# Patient Record
Sex: Male | Born: 1955 | Race: White | Hispanic: No | Marital: Married | Smoking: Former smoker
Health system: Southern US, Community
[De-identification: ages and names within clinical notes are randomized; demographics above are authoritative.]

## PROBLEM LIST (undated history)

## (undated) DIAGNOSIS — IMO0001 Reserved for inherently not codable concepts without codable children: Secondary | ICD-10-CM

## (undated) DIAGNOSIS — I739 Peripheral vascular disease, unspecified: Secondary | ICD-10-CM

## (undated) DIAGNOSIS — I209 Angina pectoris, unspecified: Secondary | ICD-10-CM

## (undated) DIAGNOSIS — I10 Essential (primary) hypertension: Secondary | ICD-10-CM

## (undated) DIAGNOSIS — K219 Gastro-esophageal reflux disease without esophagitis: Secondary | ICD-10-CM

## (undated) DIAGNOSIS — I2510 Atherosclerotic heart disease of native coronary artery without angina pectoris: Secondary | ICD-10-CM

## (undated) HISTORY — PX: BACK SURGERY: SHX140

---

## 2014-04-12 ENCOUNTER — Emergency Department: Payer: Self-pay | Admitting: Internal Medicine

## 2014-04-12 LAB — BASIC METABOLIC PANEL
ANION GAP: 6 — AB (ref 7–16)
BUN: 15 mg/dL (ref 7–18)
CHLORIDE: 103 mmol/L (ref 98–107)
CO2: 26 mmol/L (ref 21–32)
Calcium, Total: 8 mg/dL — ABNORMAL LOW (ref 8.5–10.1)
Creatinine: 1.01 mg/dL (ref 0.60–1.30)
EGFR (Non-African Amer.): >60
Glucose: 143 mg/dL — ABNORMAL HIGH (ref 65–99)
Osmolality: 273 (ref 275–301)
Potassium: 4.1 mmol/L (ref 3.5–5.1)
Sodium: 135 mmol/L — ABNORMAL LOW (ref 136–145)

## 2014-04-12 LAB — TROPONIN I
Troponin-I: <0.02 ng/mL
Troponin-I: <0.02 ng/mL

## 2014-04-12 LAB — DRUG SCREEN, URINE
Amphetamines, Ur Screen: NEGATIVE (ref ?–1000)
Barbiturates, Ur Screen: NEGATIVE (ref ?–200)
Benzodiazepine, Ur Scrn: NEGATIVE (ref ?–200)
COCAINE METABOLITE, UR ~~LOC~~: NEGATIVE (ref ?–300)
Cannabinoid 50 Ng, Ur ~~LOC~~: POSITIVE (ref ?–50)
MDMA (Ecstasy)Ur Screen: NEGATIVE (ref ?–500)
Methadone, Ur Screen: NEGATIVE (ref ?–300)
Opiate, Ur Screen: NEGATIVE (ref ?–300)
PHENCYCLIDINE (PCP) UR S: NEGATIVE (ref ?–25)
Tricyclic, Ur Screen: NEGATIVE (ref ?–1000)

## 2014-04-12 LAB — CBC
HCT: 47.6 % (ref 40.0–52.0)
HGB: 16 g/dL (ref 13.0–18.0)
MCH: 33.4 pg (ref 26.0–34.0)
MCHC: 33.6 g/dL (ref 32.0–36.0)
MCV: 100 fL (ref 80–100)
PLATELETS: 166 10*3/uL (ref 150–440)
RBC: 4.78 10*6/uL (ref 4.40–5.90)
RDW: 13 % (ref 11.5–14.5)
WBC: 5.5 10*3/uL (ref 3.8–10.6)

## 2014-11-06 DIAGNOSIS — I10 Essential (primary) hypertension: Secondary | ICD-10-CM | POA: Insufficient documentation

## 2014-11-06 DIAGNOSIS — R079 Chest pain, unspecified: Secondary | ICD-10-CM | POA: Insufficient documentation

## 2014-11-07 ENCOUNTER — Ambulatory Visit: Payer: Self-pay | Admitting: Cardiology

## 2014-12-19 ENCOUNTER — Ambulatory Visit
Admit: 2014-12-19 | Disposition: A | Discharge status: Home / Self Care | Payer: Self-pay | Attending: Family Medicine | Admitting: Family Medicine

## 2015-12-04 ENCOUNTER — Other Ambulatory Visit: Payer: Self-pay | Admitting: Cardiology

## 2015-12-04 DIAGNOSIS — I2510 Atherosclerotic heart disease of native coronary artery without angina pectoris: Secondary | ICD-10-CM | POA: Insufficient documentation

## 2015-12-05 ENCOUNTER — Encounter: Payer: Self-pay | Admitting: Certified Registered Nurse Anesthetist

## 2015-12-05 ENCOUNTER — Encounter: Payer: Self-pay | Admitting: *Deleted

## 2015-12-05 ENCOUNTER — Encounter
Admission: RE | Disposition: A | Discharge status: Home / Self Care | Payer: Self-pay | Source: Ambulatory Visit | Attending: Cardiology

## 2015-12-05 ENCOUNTER — Ambulatory Visit
Admission: RE | Admit: 2015-12-05 | Discharge: 2015-12-05 | Disposition: A | Discharge status: Home / Self Care | Payer: Self-pay | Source: Ambulatory Visit | Attending: Cardiology | Admitting: Cardiology

## 2015-12-05 DIAGNOSIS — K219 Gastro-esophageal reflux disease without esophagitis: Secondary | ICD-10-CM | POA: Insufficient documentation

## 2015-12-05 DIAGNOSIS — Z79899 Other long term (current) drug therapy: Secondary | ICD-10-CM | POA: Insufficient documentation

## 2015-12-05 DIAGNOSIS — I259 Chronic ischemic heart disease, unspecified: Secondary | ICD-10-CM | POA: Insufficient documentation

## 2015-12-05 DIAGNOSIS — Z87891 Personal history of nicotine dependence: Secondary | ICD-10-CM | POA: Insufficient documentation

## 2015-12-05 DIAGNOSIS — Z8249 Family history of ischemic heart disease and other diseases of the circulatory system: Secondary | ICD-10-CM | POA: Insufficient documentation

## 2015-12-05 DIAGNOSIS — Z7982 Long term (current) use of aspirin: Secondary | ICD-10-CM | POA: Insufficient documentation

## 2015-12-05 DIAGNOSIS — Z7902 Long term (current) use of antithrombotics/antiplatelets: Secondary | ICD-10-CM | POA: Insufficient documentation

## 2015-12-05 DIAGNOSIS — I10 Essential (primary) hypertension: Secondary | ICD-10-CM | POA: Insufficient documentation

## 2015-12-05 DIAGNOSIS — I25110 Atherosclerotic heart disease of native coronary artery with unstable angina pectoris: Secondary | ICD-10-CM | POA: Insufficient documentation

## 2015-12-05 DIAGNOSIS — E782 Mixed hyperlipidemia: Secondary | ICD-10-CM | POA: Insufficient documentation

## 2015-12-05 DIAGNOSIS — R079 Chest pain, unspecified: Secondary | ICD-10-CM | POA: Insufficient documentation

## 2015-12-05 HISTORY — DX: Essential (primary) hypertension: I10

## 2015-12-05 HISTORY — DX: Atherosclerotic heart disease of native coronary artery without angina pectoris: I25.10

## 2015-12-05 HISTORY — DX: Reserved for inherently not codable concepts without codable children: IMO0001

## 2015-12-05 HISTORY — PX: CARDIAC CATHETERIZATION: SHX172

## 2015-12-05 HISTORY — DX: Gastro-esophageal reflux disease without esophagitis: K21.9

## 2015-12-05 HISTORY — DX: Angina pectoris, unspecified: I20.9

## 2015-12-05 SURGERY — LEFT HEART CATH AND CORONARY ANGIOGRAPHY
Anesthesia: Moderate Sedation

## 2015-12-05 MED ORDER — FENTANYL CITRATE (PF) 100 MCG/2ML IJ SOLN
INTRAMUSCULAR | Status: AC
  Filled 2015-12-05: qty 2

## 2015-12-05 MED ORDER — ISOSORBIDE MONONITRATE ER 60 MG PO TB24: 60.0000 mg | ORAL_TABLET | Freq: Every day | ORAL | Status: AC

## 2015-12-05 MED ORDER — MIDAZOLAM HCL 2 MG/2ML IJ SOLN
INTRAMUSCULAR | Status: DC | PRN
  Administered 2015-12-05: 0.5 mg via INTRAVENOUS
  Administered 2015-12-05: 1 mg via INTRAVENOUS

## 2015-12-05 MED ORDER — MIDAZOLAM HCL 2 MG/2ML IJ SOLN
INTRAMUSCULAR | Status: AC
  Filled 2015-12-05: qty 2

## 2015-12-05 MED ORDER — SODIUM CHLORIDE 0.9% FLUSH: 3.0000 mL | Freq: Two times a day (BID) | INTRAVENOUS | Status: DC

## 2015-12-05 MED ORDER — SODIUM CHLORIDE 0.9 % WEIGHT BASED INFUSION
3.0000 mL/kg/h | INTRAVENOUS | Status: DC
  Administered 2015-12-05: 3 mL/kg/h via INTRAVENOUS

## 2015-12-05 MED ORDER — FENTANYL CITRATE (PF) 100 MCG/2ML IJ SOLN
INTRAMUSCULAR | Status: DC | PRN
  Administered 2015-12-05 (×2): 25 ug via INTRAVENOUS

## 2015-12-05 MED ORDER — NITROGLYCERIN 2 % TD OINT
TOPICAL_OINTMENT | TRANSDERMAL | Status: DC | PRN
  Administered 2015-12-05: 1 [in_us] via TOPICAL

## 2015-12-05 MED ORDER — SODIUM CHLORIDE 0.9% FLUSH: 3.0000 mL | INTRAVENOUS | Status: DC | PRN

## 2015-12-05 MED ORDER — NITROGLYCERIN 2 % TD OINT
TOPICAL_OINTMENT | TRANSDERMAL | Status: AC
  Filled 2015-12-05: qty 1

## 2015-12-05 MED ORDER — SODIUM CHLORIDE 0.9 % IV SOLN: 250.0000 mL | INTRAVENOUS | Status: DC | PRN

## 2015-12-05 MED ORDER — IOHEXOL 300 MG/ML  SOLN
INTRAMUSCULAR | Status: DC | PRN
  Administered 2015-12-05: 95 mL via INTRA_ARTERIAL

## 2015-12-05 MED ORDER — HEPARIN (PORCINE) IN NACL 2-0.9 UNIT/ML-% IJ SOLN
INTRAMUSCULAR | Status: AC
  Filled 2015-12-05: qty 500

## 2015-12-05 SURGICAL SUPPLY — 8 items
CATH INFINITI 5FR ANG PIGTAIL (CATHETERS) ×3 IMPLANT
CATH INFINITI 5FR JL4 (CATHETERS) ×3 IMPLANT
CATH INFINITI JR4 5F (CATHETERS) ×3 IMPLANT
KIT MANI 3VAL PERCEP (MISCELLANEOUS) ×3 IMPLANT
NEEDLE PERC 18GX7CM (NEEDLE) ×3 IMPLANT
PACK CARDIAC CATH (CUSTOM PROCEDURE TRAY) ×3 IMPLANT
SHEATH PINNACLE 5F 10CM (SHEATH) ×3 IMPLANT
WIRE EMERALD 3MM-J .035X150CM (WIRE) ×3 IMPLANT

## 2015-12-05 NOTE — Discharge Instructions (Signed)
Angiogram, Care After °Refer to this sheet in the next few weeks. These instructions provide you with information about caring for yourself after your procedure. Your health care provider may also give you more specific instructions. Your treatment has been planned according to current medical practices, but problems sometimes occur. Call your health care provider if you have any problems or questions after your procedure. °WHAT TO EXPECT AFTER THE PROCEDURE °After your procedure, it is typical to have the following: °· Bruising at the catheter insertion site that usually fades within 1-2 weeks. °· Blood collecting in the tissue (hematoma) that may be painful to the touch. It should usually decrease in size and tenderness within 1-2 weeks. °HOME CARE INSTRUCTIONS °· Take medicines only as directed by your health care provider. °· You may shower 24-48 hours after the procedure or as directed by your health care provider. Remove the bandage (dressing) and gently wash the site with plain soap and water. Pat the area dry with a clean towel. Do not rub the site, because this may cause bleeding. °· Do not take baths, swim, or use a hot tub until your health care provider approves. °· Check your insertion site every day for redness, swelling, or drainage. °· Do not apply powder or lotion to the site. °· Do not lift over 10 lb (4.5 kg) for 5 days after your procedure or as directed by your health care provider. °· Ask your health care provider when it is okay to: °¨ Return to work or school. °¨ Resume usual physical activities or sports. °¨ Resume sexual activity. °· Do not drive home if you are discharged the same day as the procedure. Have someone else drive you. °· You may drive 24 hours after the procedure unless otherwise instructed by your health care provider. °· Do not operate machinery or power tools for 24 hours after the procedure or as directed by your health care provider. °· If your procedure was done as an  outpatient procedure, which means that you went home the same day as your procedure, a responsible adult should be with you for the first 24 hours after you arrive home. °· Keep all follow-up visits as directed by your health care provider. This is important. °SEEK MEDICAL CARE IF: °· You have a fever. °· You have chills. °· You have increased bleeding from the catheter insertion site. Hold pressure on the site. °SEEK IMMEDIATE MEDICAL CARE IF: °· You have unusual pain at the catheter insertion site. °· You have redness, warmth, or swelling at the catheter insertion site. °· You have drainage (other than a small amount of blood on the dressing) from the catheter insertion site. °· The catheter insertion site is bleeding, and the bleeding does not stop after 30 minutes of holding steady pressure on the site. °· The area near or just beyond the catheter insertion site becomes pale, cool, tingly, or numb. °  °This information is not intended to replace advice given to you by your health care provider. Make sure you discuss any questions you have with your health care provider. °  °Document Released: 03/20/2005 Document Revised: 09/22/2014 Document Reviewed: 02/02/2013 °Elsevier Interactive Patient Education ©2016 [employer] ° °

## 2015-12-05 NOTE — H&P (Signed)
Chief Complaint: Chief Complaint  Patient presents with  . 6 month follow up  no complaints with heart  Date of Service: 11/19/2015 Date of Birth: April 22, 1956 PCP: Malva Limes, MD  History of Present Illness: Brad Clark is a 60 y.o.male patient who returns for follow-up visit. He is status post a cardiac catheterization revealing 95% mid left circumflex stenosis. There was also an 80% distal left circumflex is stenosis he underwent PCI of both lesions with placement of a Xience 2.25 x 15 mm stent in the proximal left circumflex and a 2.0 x 12 mm bare metal stent in the more distal lesion. He is doing fairly well since this procedure. He has been compliant with clopidogrel. He is a pretty well with no chest pain or shortness of breath. He is compliant with his medications and able to carry out daily activities without symptoms. He is limited somewhat by back pain. He is on dual antiplatelet therapy with aspirin and clopidogrel. He has self discontinued lovastatin.  Past Medical and Surgical History  Past Medical History Past Medical History  Diagnosis Date  . GERD (gastroesophageal reflux disease)  . Hyperlipidemia, unspecified  . Hypertension   Past Surgical History He has no past surgical history on file.   Medications and Allergies  Current Medications  Current Outpatient Prescriptions  Medication Sig Dispense Refill  . aspirin 81 MG chewable tablet Take 81 mg by mouth once daily.  . clopidogrel (PLAVIX) 75 mg tablet Take 1 tablet (75 mg total) by mouth once daily. 30 tablet 6  . ibuprofen (ADVIL,MOTRIN) 200 MG tablet Take 200 mg by mouth every 6 (six) hours as needed for Pain.  . metoprolol succinate (TOPROL-XL) 25 MG XL tablet Take 1 tablet (25 mg total) by mouth once daily. 30 tablet 11  . nitroGLYcerin (NITROSTAT) 0.4 MG SL tablet Place under the tongue.  . pantoprazole (PROTONIX) 40 MG DR tablet Take 40 mg by mouth once daily.   No current facility-administered  medications for this visit.   Allergies: Review of patient's allergies indicates no known allergies.  Social and Family History  Social History reports that he has been smoking Cigarettes. He does not have any smokeless tobacco history on file. He reports that he drinks alcohol.  Family History History reviewed. No pertinent family history.  Review of Systems  Review of Systems  Constitutional: Negative for chills, diaphoresis, fever, malaise/fatigue and weight loss.  HENT: Negative for congestion, ear discharge, hearing loss and tinnitus.  Eyes: Negative for blurred vision.  Respiratory: Negative for cough, hemoptysis, sputum production, shortness of breath and wheezing.  Cardiovascular: Negative for chest pain, palpitations, orthopnea, claudication, leg swelling and PND.  Gastrointestinal: Negative for abdominal pain, blood in stool, constipation, diarrhea, heartburn, melena, nausea and vomiting.  Genitourinary: Negative for dysuria, frequency, hematuria and urgency.  Musculoskeletal: Positive for back pain and joint pain. Negative for falls and myalgias.  Skin: Negative for itching and rash.  Neurological: Negative for dizziness, tingling, focal weakness, loss of consciousness, weakness and headaches.  Endo/Heme/Allergies: Negative for polydipsia. Does not bruise/bleed easily.  Psychiatric/Behavioral: Negative for depression, memory loss and substance abuse. The patient is not nervous/anxious.    Physical Examination   Vitals: Visit Vitals  . BP (!) 160/102  . Pulse 76  . Resp 12  . Ht 170.2 cm ( )  . Wt 95.3 kg (210 lb)  . BMI 32.89 kg/m2   Ht:170.2 cm ( ) Wt:95.3 kg (210 lb) ZOX:WRUE surface area is 2.12 meters squared.  Body mass index is 32.89 kg/(m^2).  Wt Readings from Last 3 Encounters:  11/19/15 95.3 kg (210 lb)  05/08/15 93.9 kg (207 lb)  11/13/14 93.1 kg (205 lb 3.2 oz)   BP Readings from Last 3 Encounters:  11/19/15 (!) 160/102  05/08/15 160/80   11/13/14 142/82   General appearance appears in no acute distress  Head Mouth and Eye exam Normocephalic, without obvious abnormality, atraumatic Dentition is good Eyes appear anicteric   Neck exam Thyroid: normal  Nodes: no obvious adenopathy  LUNGS Breath Sounds: Normal Percussion: Normal  CARDIOVASCULAR JVP CV wave: no HJR: no Elevation at 90 degrees: None Carotid Pulse: normal pulsation bilaterally Bruit: None Apex: apical impulse normal  Auscultation Rhythm: normal sinus rhythm S1: normal S2: normal Clicks: no Rub: no Murmurs: no murmurs  Gallop: None ABDOMEN Liver enlargement: no Pulsatile aorta: no Ascites: no Bruits: no  EXTREMITIES Clubbing: no Edema: trace to 1+ bilateral pedal edema Pulses: peripheral pulses symmetrical Femoral Bruits: no Amputation: no SKIN Rash: no Cyanosis: no Embolic phemonenon: no Bruising: no NEURO Alert and Oriented to person, place and time: yes Non focal: yes  PSYCH: Pt appears to have normal affect  LABS REVIEWED Last 3 CBC results: Lab Results  Component Value Date  WBC 6.4 11/06/2014   Lab Results  Component Value Date  HGB 16.4 11/06/2014   Lab Results  Component Value Date  HCT 46.4 11/06/2014   Lab Results  Component Value Date  PLT 183 11/06/2014   Lab Results  Component Value Date  CREATININE 1.1 11/06/2014  BUN 9 11/06/2014  NA 137 11/06/2014  K 4.3 11/06/2014  CL 101 11/06/2014  CO2 31.3 11/06/2014   Assessment and Plan   60 y.o. male with  ICD-10-CM ICD-9-CM  1. CAD S/P percutaneous coronary angioplasty-appears to be doing quite well from a cardiac standpoint post PCI. Continue on dual antiplatelet therapy I25.10 414.01  Z98.61 V45.82  2. Essential hypertension-blood pressure is controlled with current regimen. Dash diet is recommended I10 401.9  3. Familial multiple lipoprotein-type hyperlipidemia-continue with low-fat diet. Statin would be recommended however the patient  defers this at present. E78.4 272.4  4. SOB (shortness of breath)-currently stay R06.02 786.05  5. Chest pain on exertion-no current chest pain R07.9 786.50  6. Atypical chest pain-Persistant recurrent chest pain similar to angina. Angina is unstable. Will proceed with cardiac cath in am to evaluate anatomy. Advised to go to er if symptoms worsen.   ).  These notes generated with voice recognition software. I apologize for typographical errors.  Brad ArKENNETH ALAN Guiseppe Flanagan, MD

## 2015-12-06 ENCOUNTER — Other Ambulatory Visit: Payer: Self-pay | Admitting: Student

## 2015-12-06 DIAGNOSIS — M545 Low back pain, unspecified: Secondary | ICD-10-CM

## 2015-12-13 ENCOUNTER — Telehealth: Payer: Self-pay | Admitting: Family Medicine

## 2015-12-13 NOTE — Telephone Encounter (Signed)
I looked in patient chart, both in [employer] and Epic and found no past history of Gout. Its been almost a year since patient's last office visit. I called patient back to get more information. He states "I have swelling in my big toe and need gout medication called into the pharmacy". I advised patient that we have no record of him having a history of Gout. Patient replies " I had it a long time ago and was treated somewhere else". I advised patient that it has been almost 1 year since his last office visit. Patient interrupts me and says "Im not coming in for an office visit, I just need medication; that's the least you could do". He then says "just forget it. Thanks for your time" and then hangs the phone up.

## 2015-12-13 NOTE — Telephone Encounter (Signed)
Pt is wanting to see if you would give him medication gout and would like to have it sent to [facility] at [address].  Pt states he has no insurance.  His callback number is [phone] which is his work number.

## 2015-12-28 ENCOUNTER — Ambulatory Visit
Admission: RE | Admit: 2015-12-28 | Discharge: 2015-12-28 | Disposition: A | Discharge status: Home / Self Care | Payer: Self-pay | Source: Ambulatory Visit | Attending: Student | Admitting: Student

## 2015-12-28 DIAGNOSIS — M4806 Spinal stenosis, lumbar region: Secondary | ICD-10-CM | POA: Insufficient documentation

## 2015-12-28 DIAGNOSIS — M545 Low back pain, unspecified: Secondary | ICD-10-CM

## 2015-12-28 DIAGNOSIS — M5126 Other intervertebral disc displacement, lumbar region: Secondary | ICD-10-CM | POA: Insufficient documentation

## 2017-11-02 IMAGING — MR MR LUMBAR SPINE W/O CM
4 of 5 series · 24 of 48 positions shown · non-contrast
Comparison: Lumbar spine radiographs 12/19/2014. Report of MRI
lumbar spine 07/11/2003.

CLINICAL DATA: Low back pain with left hip soreness over the last 3
months. Lumbar spine surgery in 5111.

EXAM:
MRI LUMBAR SPINE WITHOUT CONTRAST
TECHNIQUE: Multiplanar, multisequence MR imaging of the lumbar spine was
performed. No intravenous contrast was administered.

[Series 2: T2 · sagittal · 4.0mm · 0.81mm/px · 6 of 15 slices shown (1 of 2)]
[im 1/15]
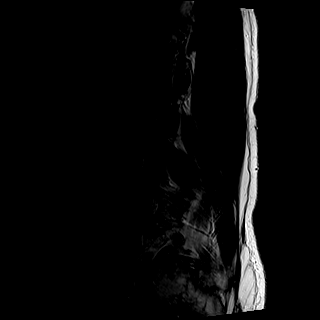
[im 3/15]
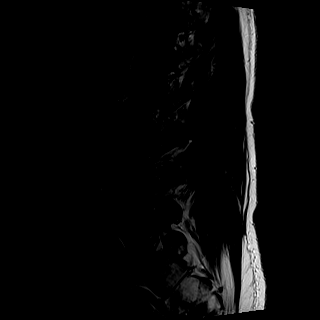
[im 6/15]
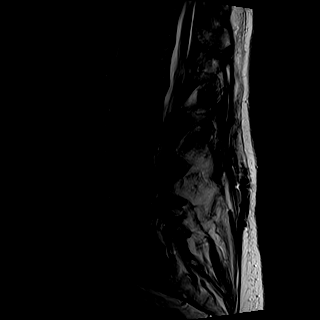
[im 9/15]
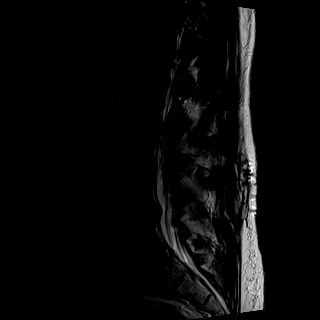
[im 12/15]
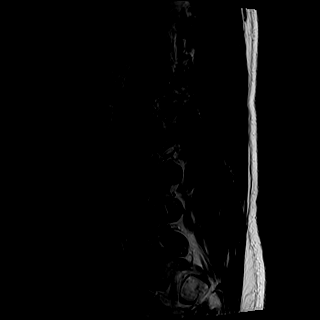
[im 15/15]
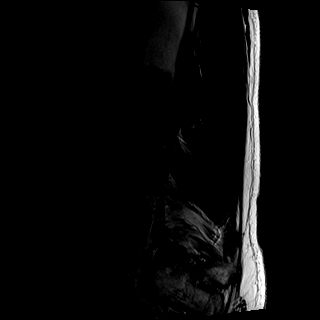

[Series 3: T1 · sagittal · 4.0mm · 0.41mm/px · 6 of 15 slices shown (1 of 2)]
[im 1/15]
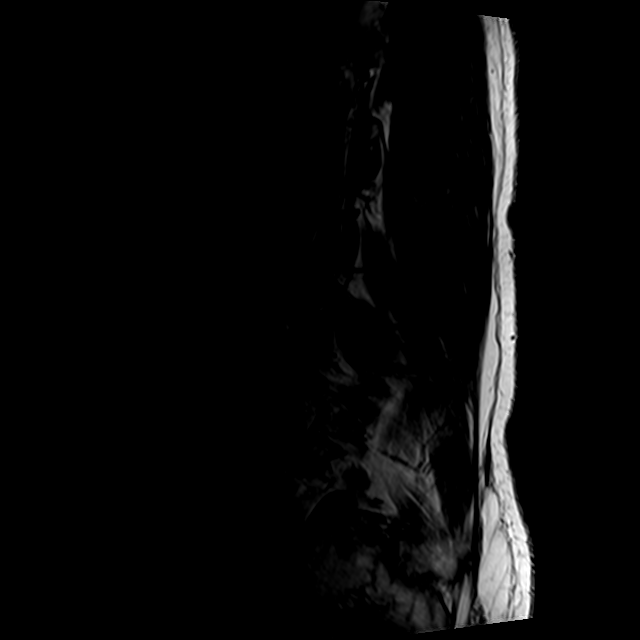
[im 3/15]
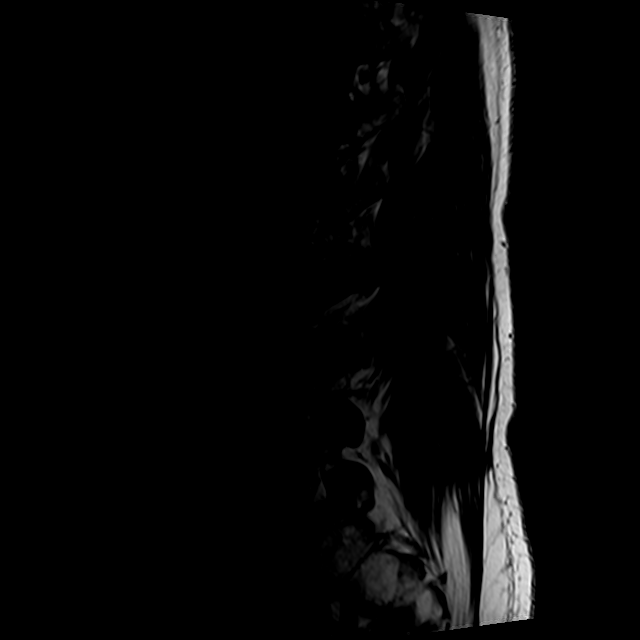
[im 6/15]
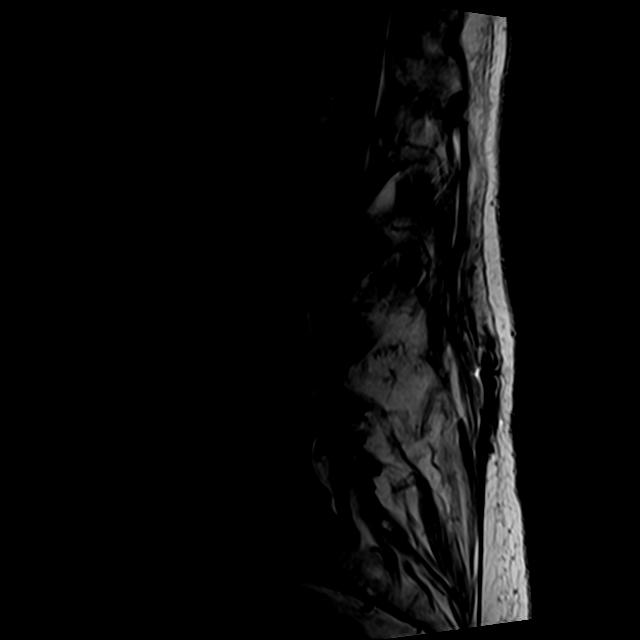
[im 9/15]
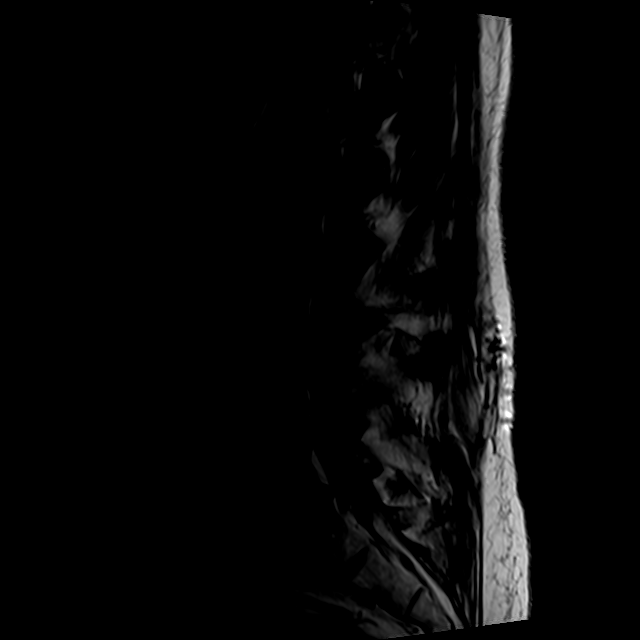
[im 12/15]
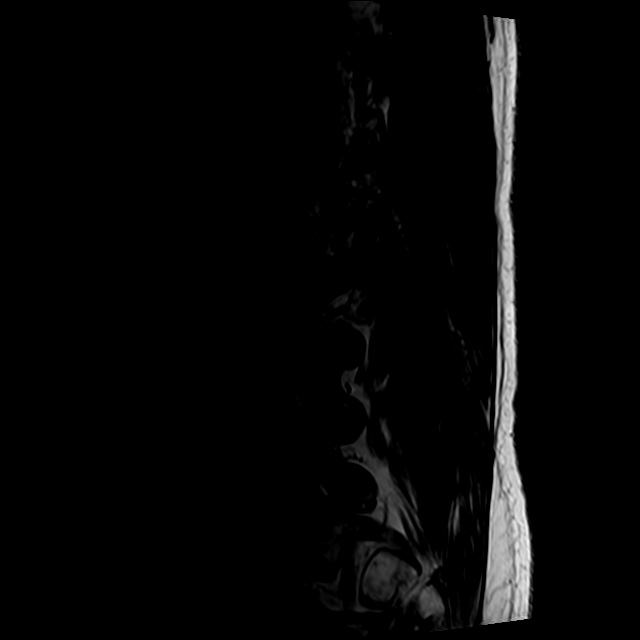
[im 15/15]
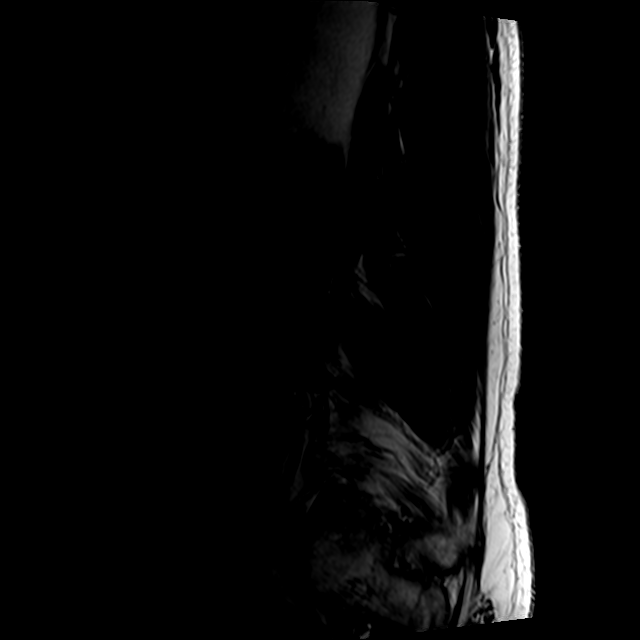

[Series 5: T2 · axial · 4.0mm · 0.78mm/px · z∈[-45,+179]mm · 9 of 41 slices shown (2 of 2)]
[im 1/41]
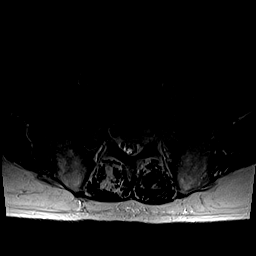
[im 6/41]
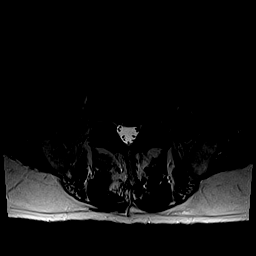
[im 12/41]
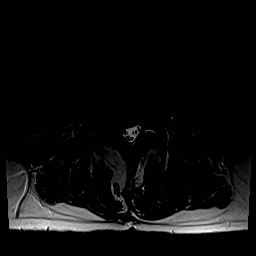
[im 18/41]
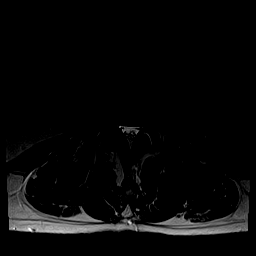
[im 21/41]
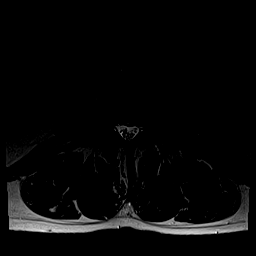
[im 23/41]
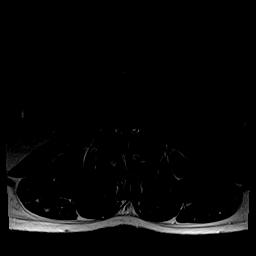
[im 29/41]
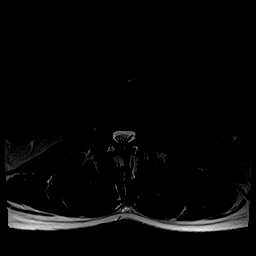
[im 35/41]
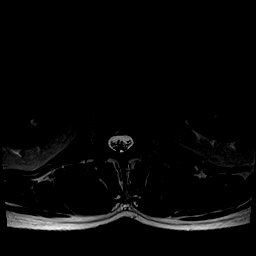
[im 41/41]
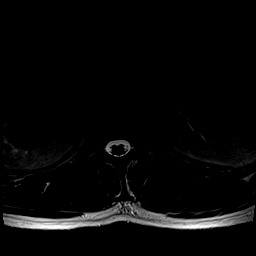

[Series 6: T1 · axial · 4.0mm · 0.31mm/px · z∈[-20,+149]mm · 3 of 41 slices shown (2 of 2)]
[im 6/41]
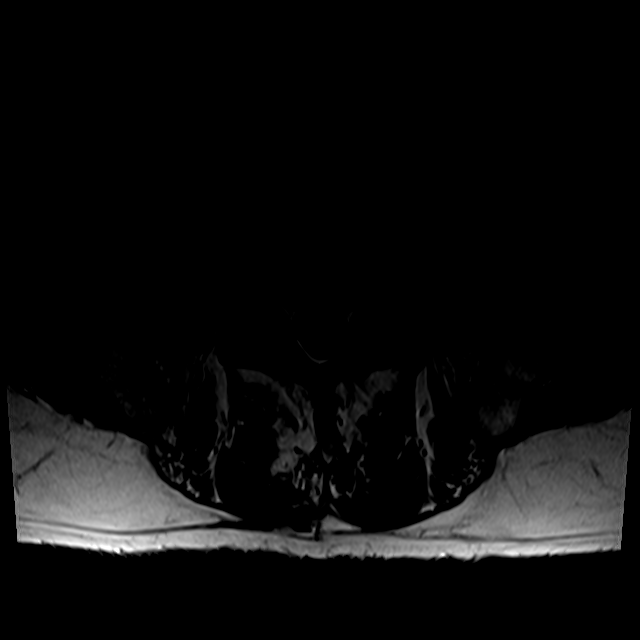
[im 21/41]
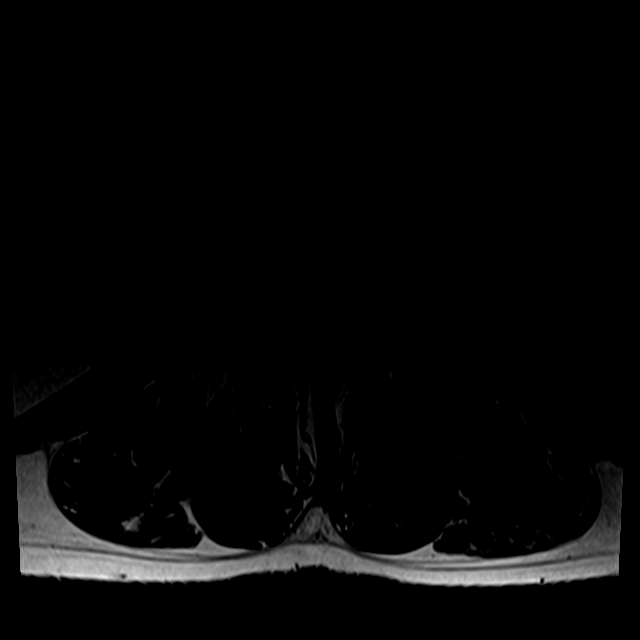
[im 35/41]
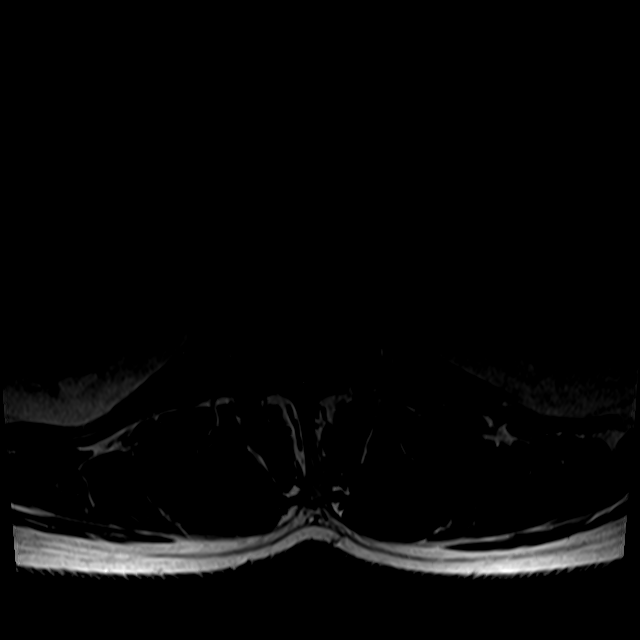

[24 of 48 positions shown; findings below may reference images not displayed]

FINDINGS: Normal signal is present a conus medullaris which terminates at L1.
Chronic endplate marrow changes are evident at L4-5. These are worse
on the right. Asymmetric left-sided endplate marrow changes are
present at L5-S1. Vertebral body heights and AP alignment are
normal.

Limited imaging of the abdomen is unremarkable. There is no
significant adenopathy. Atherosclerotic changes are evident in the
aorta without aneurysm.

L1-2: Mild disc bulging and facet hypertrophy is present bilaterally
without significant stenosis.

L2-3: A broad-based disc protrusion is present. Moderate facet
hypertrophy and short pedicles results an moderate to severe central
canal stenosis. Subarticular narrowing is worse on the left. Mild
foraminal narrowing is present bilaterally.

L3-4: A broad-based disc protrusion is present. Mild facet
hypertrophy and short pedicles contribute to mild subarticular
narrowing bilaterally. Mild to moderate foraminal stenosis is worse
on the right.

L4-5: A right laminectomy is present. A recurrent right paramedian
disc protrusion is noted. This results in moderate right and mild
left subarticular narrowing. Moderate right and mild left foraminal
stenosis is present as well.

L5-S1: A shallow central disc protrusion is present. The central
canal is patent. The a leftward component contributes to mild left
foraminal stenosis.
IMPRESSION: 1. Broad-based disc protrusion, moderate facet hypertrophy, and
short pedicles at L2-3 results and moderate to severe central canal
stenosis.
2. Subarticular narrowing at L2-3 is worse on the left.
3. Mild foraminal narrowing bilaterally at L2-3.
4. Mild subarticular narrowing at L3-4 with mild to moderate
foraminal stenosis, worse on the right.
5. Right laminectomy at L4-5 with the re- current right paramedian
disc protrusion resulting in moderate right and mild left
subarticular narrowing and foraminal stenosis.
6. Shallow central disc protrusion and far left lateral component at
L5-S1 with mild left foraminal stenosis.

## 2019-03-03 DIAGNOSIS — E1169 Type 2 diabetes mellitus with other specified complication: Secondary | ICD-10-CM | POA: Insufficient documentation

## 2019-03-03 DIAGNOSIS — E119 Type 2 diabetes mellitus without complications: Secondary | ICD-10-CM | POA: Insufficient documentation

## 2019-03-03 DIAGNOSIS — E785 Hyperlipidemia, unspecified: Secondary | ICD-10-CM | POA: Insufficient documentation

## 2019-03-11 ENCOUNTER — Ambulatory Visit (INDEPENDENT_AMBULATORY_CARE_PROVIDER_SITE_OTHER): Payer: Self-pay | Admitting: Vascular Surgery

## 2019-03-11 ENCOUNTER — Other Ambulatory Visit: Payer: Self-pay

## 2019-03-11 ENCOUNTER — Encounter (INDEPENDENT_AMBULATORY_CARE_PROVIDER_SITE_OTHER): Payer: Self-pay | Admitting: Vascular Surgery

## 2019-03-11 VITALS — BP 168/98 | HR 94 | Resp 16 | Ht 69.0 in | Wt 203.0 lb

## 2019-03-11 DIAGNOSIS — E1151 Type 2 diabetes mellitus with diabetic peripheral angiopathy without gangrene: Secondary | ICD-10-CM

## 2019-03-11 DIAGNOSIS — I70213 Atherosclerosis of native arteries of extremities with intermittent claudication, bilateral legs: Secondary | ICD-10-CM

## 2019-03-11 DIAGNOSIS — I70219 Atherosclerosis of native arteries of extremities with intermittent claudication, unspecified extremity: Secondary | ICD-10-CM | POA: Insufficient documentation

## 2019-03-11 DIAGNOSIS — I2510 Atherosclerotic heart disease of native coronary artery without angina pectoris: Secondary | ICD-10-CM

## 2019-03-11 DIAGNOSIS — E119 Type 2 diabetes mellitus without complications: Secondary | ICD-10-CM | POA: Insufficient documentation

## 2019-03-11 DIAGNOSIS — I10 Essential (primary) hypertension: Secondary | ICD-10-CM

## 2019-03-11 DIAGNOSIS — F17200 Nicotine dependence, unspecified, uncomplicated: Secondary | ICD-10-CM

## 2019-03-11 DIAGNOSIS — Z79899 Other long term (current) drug therapy: Secondary | ICD-10-CM

## 2019-03-11 NOTE — Assessment & Plan Note (Signed)
blood pressure control important in reducing the progression of atherosclerotic disease. On appropriate oral medications.  

## 2019-03-11 NOTE — Assessment & Plan Note (Signed)
The patient does have significant symptoms of claudication in both lower extremities with noninvasive studies that would demonstrate significant arterial insufficiency in both legs.  At this point we had a long discussion regarding the pathophysiology and natural history of peripheral arterial disease.  I have strongly urged him to stop smoking and he is going to give an earnest effort to quit.  He does not have any immediate limb threatening symptoms, but his symptoms are quite disabling.  We are going to give him a trial of exercise, improve blood pressure and glucose control, and an attempt at smoking cessation over the next 4 to 6 weeks.  If his symptoms do not significantly improve with his attempted conservative management, consideration for intervention will be given to both lower extremities.  If he develops rest pain or ulceration in the interim, he will contact our office and intervention should be performed.  I discussed at this point he does not have any immediate limb threatening symptoms, and that a trial of conservative therapy is appropriate

## 2019-03-11 NOTE — Assessment & Plan Note (Signed)
blood glucose control important in reducing the progression of atherosclerotic disease. Also, involved in wound healing. On appropriate medications.  

## 2019-03-11 NOTE — Progress Notes (Signed)
Patient ID: Brad Clark, male   DOB: 1955/10/08, 63 y.o.   MRN: [id number]  Chief Complaint  Patient presents with  . New Patient (Initial Visit)    ref fath for PVD    HPI Brad Clark is a 63 y.o. male.  I am asked to see the patient by Dr. Ubaldo Glassing for evaluation of PAD with claudication.  He reports several months of worsening pain in his legs with activity.  He can really only walk about 50 to 100 feet before having to stop and rest at this point.  Both legs are affected and he says about the same.  His activity level has definitely dropped over the past several months.  He says when he is at rest, he currently does not really have pain but when he starts walking his calves and lower legs burn and ache.  The pain becomes debilitating and eventually he has to stop.  No open ulcerations or infection.  His cardiologist evaluated him and performed ABIs which showed a right ABI of 0.63 and a left ABI of 0.79 with blunted waveforms bilaterally.  Given his significant peripheral arterial disease, he is referred for further evaluation and treatment.  He is trying to stop smoking but does still smoke.  There is no clear inciting event or causative factor that started the symptoms.     Past Medical History:  Diagnosis Date  . Anginal pain ([place])   . Coronary artery disease   . GERD (gastroesophageal reflux disease)   . Hypertension   . Shortness of breath dyspnea     Past Surgical History:  Procedure Laterality Date  . BACK SURGERY    . CARDIAC CATHETERIZATION    . CARDIAC CATHETERIZATION N/A 12/05/2015   Procedure: Left Heart Cath and Coronary Angiography;  Surgeon: Teodoro Spray, MD;  Location: [facility];  Service: Cardiovascular;  Laterality: N/A;    Family History Family History  Problem Relation Age of Onset  . Congenital heart disease Father   . Diabetes Brother   No bleeding or clotting disorders.  No aneurysms  Social History Was smoking >1 PPD,  trying to quit Married Regular ETOH use No IVDU  No Known Allergies  Current Outpatient Medications  Medication Sig Dispense Refill  . aspirin 81 MG tablet Take 81 mg by mouth daily.    . chlorthalidone (HYGROTON) 25 MG tablet Take 25 mg by mouth daily.    . clopidogrel (PLAVIX) 75 MG tablet Take 75 mg by mouth daily.    . isosorbide mononitrate (IMDUR) 60 MG 24 hr tablet Take 1 tablet (60 mg total) by mouth daily. 30 tablet 11  . metFORMIN (GLUCOPHAGE) 500 MG tablet Take 1,000 mg by mouth 2 (two) times daily with a meal.    . metoprolol succinate (TOPROL-XL) 25 MG 24 hr tablet Take 25 mg by mouth daily.    . naproxen sodium (ANAPROX) 220 MG tablet Take 220 mg by mouth daily as needed.    . nitroGLYCERIN (NITROSTAT) 0.4 MG SL tablet Place 0.4 mg under the tongue every 5 (five) minutes as needed for chest pain.    . pantoprazole (PROTONIX) 40 MG tablet Take 40 mg by mouth daily.    . rosuvastatin (CRESTOR) 5 MG tablet Take by mouth.     No current facility-administered medications for this visit.       REVIEW OF SYSTEMS (Negative unless checked)  Constitutional: [] Weight loss  [] Fever  [] Chills Cardiac: [] Chest pain   []   Chest pressure   [] Palpitations   [] Shortness of breath when laying flat   [] Shortness of breath at rest   [] Shortness of breath with exertion. Vascular:  [x] Pain in legs with walking   [] Pain in legs at rest   [] Pain in legs when laying flat   [x] Claudication   [] Pain in feet when walking  [] Pain in feet at rest  [] Pain in feet when laying flat   [] History of DVT   [] Phlebitis   [] Swelling in legs   [] Varicose veins   [] Non-healing ulcers Pulmonary:   [] Uses home oxygen   [] Productive cough   [] Hemoptysis   [] Wheeze  [] COPD   [] Asthma Neurologic:  [] Dizziness  [] Blackouts   [] Seizures   [] History of stroke   [] History of TIA  [] Aphasia   [] Temporary blindness   [] Dysphagia   [] Weakness or numbness in arms   [] Weakness or numbness in legs Musculoskeletal:  [x] Arthritis    [] Joint swelling   [x] Joint pain   [] Low back pain Hematologic:  [] Easy bruising  [] Easy bleeding   [] Hypercoagulable state   [] Anemic  [] Hepatitis Gastrointestinal:  [] Blood in stool   [] Vomiting blood  [x] Gastroesophageal reflux/heartburn   [] Abdominal pain Genitourinary:  [] Chronic kidney disease   [] Difficult urination  [] Frequent urination  [] Burning with urination   [] Hematuria Skin:  [] Rashes   [] Ulcers   [] Wounds Psychological:  [] History of anxiety   []  History of major depression.    Physical Exam BP (!) 168/98 (BP Location: Left Arm)   Pulse 94   Resp 16   Ht 5\' 9"  (1.753 m)   Wt 203 lb (92.1 kg)   BMI 29.98 kg/m  Gen:  WD/WN, NAD. Appears older than stated age. Head: [place]/AT, No temporalis wasting.  Ear/Nose/Throat: Hearing grossly intact, nares w/o erythema or drainage, oropharynx w/o Erythema/Exudate Eyes: Conjunctiva clear, sclera non-icteric  Neck: trachea midline.  No JVD.  Pulmonary:  Good air movement, respirations not labored, no use of accessory muscles  Cardiac: RRR, no JVD Vascular:  Vessel Right Left  Radial Palpable Palpable                          DP 1+ 1+  PT NP 1+   Gastrointestinal:. No masses, surgical incisions, or scars. Musculoskeletal: M/S 5/5 throughout.  Extremities without ischemic changes.  No deformity or atrophy. No significant LE edema. Neurologic: Sensation grossly intact in extremities.  Symmetrical.  Speech is fluent. Motor exam as listed above. Psychiatric: Judgment intact, Mood & affect appropriate for pt's clinical situation. Dermatologic: No rashes or ulcers noted.  No cellulitis or open wounds.    Radiology No results found.  Labs No results found for this or any previous visit (from the past 2160 hour(s)).  Assessment/Plan:  Essential (primary) hypertension blood pressure control important in reducing the progression of atherosclerotic disease. On appropriate oral medications.   Diabetes (HCC) blood glucose  control important in reducing the progression of atherosclerotic disease. Also, involved in wound healing. On appropriate medications.   CAD in native artery Continue cardiac and antihypertensive medications as already ordered and reviewed, no changes at this time. Continue statin as ordered and reviewed, no changes at this time Nitrates PRN for chest pain   Tobacco use disorder We had a discussion for approximately 3-4 minutes regarding the absolute need for smoking cessation due to the deleterious nature of tobacco on the vascular system. We discussed the tobacco use would diminish patency of any intervention, and likely significantly  worsen progressio of disease. We discussed multiple agents for quitting including replacement therapy or medications to reduce cravings such as Chantix. The patient voices their understanding of the importance of smoking cessation.  Atherosclerosis of native arteries of extremity with intermittent claudication (HCC) The patient does have significant symptoms of claudication in both lower extremities with noninvasive studies that would demonstrate significant arterial insufficiency in both legs.  At this point we had a long discussion regarding the pathophysiology and natural history of peripheral arterial disease.  I have strongly urged him to stop smoking and he is going to give an earnest effort to quit.  He does not have any immediate limb threatening symptoms, but his symptoms are quite disabling.  We are going to give him a trial of exercise, improve blood pressure and glucose control, and an attempt at smoking cessation over the next 4 to 6 weeks.  If his symptoms do not significantly improve with his attempted conservative management, consideration for intervention will be given to both lower extremities.  If he develops rest pain or ulceration in the interim, he will contact our office and intervention should be performed.  I discussed at this point he does not  have any immediate limb threatening symptoms, and that a trial of conservative therapy is appropriate      Festus BarrenJason Dew 03/11/2019, 3:24 PM   This note was created with Dragon medical transcription system.  Any errors from dictation are unintentional.

## 2019-03-11 NOTE — Patient Instructions (Signed)
Peripheral Vascular Disease  Peripheral vascular disease (PVD) is a disease of the blood vessels that are not part of your heart and brain. A simple term for PVD is poor circulation. In most cases, PVD narrows the blood vessels that carry blood from your heart to the rest of your body. This can reduce the supply of blood to your arms, legs, and internal organs, like your stomach or kidneys. However, PVD most often affects a person's lower legs and feet. Without treatment, PVD tends to get worse. PVD can also lead to acute ischemic limb. This is when an arm or leg suddenly cannot get enough blood. This is a medical emergency. Follow these instructions at home: Lifestyle  Do not use any products that contain nicotine or tobacco, such as cigarettes and e-cigarettes. If you need help quitting, ask your doctor.  Lose weight if you are overweight. Or, stay at a healthy weight as told by your doctor.  Eat a diet that is low in fat and cholesterol. If you need help, ask your doctor.  Exercise regularly. Ask your doctor for activities that are right for you. General instructions  Take over-the-counter and prescription medicines only as told by your doctor.  Take good care of your feet: ? Wear comfortable shoes that fit well. ? Check your feet often for any cuts or sores.  Keep all follow-up visits as told by your doctor This is important. Contact a doctor if:  You have cramps in your legs when you walk.  You have leg pain when you are at rest.  You have coldness in a leg or foot.  Your skin changes.  You are unable to get or have an erection (erectile dysfunction).  You have cuts or sores on your feet that do not heal. Get help right away if:  Your arm or leg turns cold, numb, and blue.  Your arms or legs become red, warm, swollen, painful, or numb.  You have chest pain.  You have trouble breathing.  You suddenly have weakness in your face, arm, or leg.  You become very  confused or you cannot speak.  You suddenly have a very bad headache.  You suddenly cannot see. Summary  Peripheral vascular disease (PVD) is a disease of the blood vessels.  A simple term for PVD is poor circulation. Without treatment, PVD tends to get worse.  Treatment may include exercise, low fat and low cholesterol diet, and quitting smoking. This information is not intended to replace advice given to you by your health care provider. Make sure you discuss any questions you have with your health care provider. Document Released: 11/26/2009 Document Revised: 08/14/2017 Document Reviewed: 10/09/2016 Elsevier Patient Education  2020 Elsevier Inc.  

## 2019-03-11 NOTE — Assessment & Plan Note (Signed)
We had a discussion for approximately 3-4 minutes regarding the absolute need for smoking cessation due to the deleterious nature of tobacco on the vascular system. We discussed the tobacco use would diminish patency of any intervention, and likely significantly worsen progressio of disease. We discussed multiple agents for quitting including replacement therapy or medications to reduce cravings such as Chantix. The patient voices their understanding of the importance of smoking cessation.  

## 2019-03-11 NOTE — Assessment & Plan Note (Signed)
Continue cardiac and antihypertensive medications as already ordered and reviewed, no changes at this time. Continue statin as ordered and reviewed, no changes at this time Nitrates PRN for chest pain  

## 2019-04-11 ENCOUNTER — Encounter (INDEPENDENT_AMBULATORY_CARE_PROVIDER_SITE_OTHER): Payer: Self-pay

## 2019-04-11 ENCOUNTER — Encounter (INDEPENDENT_AMBULATORY_CARE_PROVIDER_SITE_OTHER): Payer: Self-pay | Admitting: Nurse Practitioner

## 2019-04-11 ENCOUNTER — Ambulatory Visit (INDEPENDENT_AMBULATORY_CARE_PROVIDER_SITE_OTHER): Payer: Self-pay

## 2019-04-11 ENCOUNTER — Telehealth (INDEPENDENT_AMBULATORY_CARE_PROVIDER_SITE_OTHER): Payer: Self-pay

## 2019-04-11 ENCOUNTER — Other Ambulatory Visit: Payer: Self-pay

## 2019-04-11 ENCOUNTER — Ambulatory Visit (INDEPENDENT_AMBULATORY_CARE_PROVIDER_SITE_OTHER): Payer: Self-pay | Admitting: Nurse Practitioner

## 2019-04-11 VITALS — BP 151/83 | HR 80 | Resp 16 | Wt 198.0 lb

## 2019-04-11 DIAGNOSIS — Z7984 Long term (current) use of oral hypoglycemic drugs: Secondary | ICD-10-CM

## 2019-04-11 DIAGNOSIS — I10 Essential (primary) hypertension: Secondary | ICD-10-CM

## 2019-04-11 DIAGNOSIS — I70213 Atherosclerosis of native arteries of extremities with intermittent claudication, bilateral legs: Secondary | ICD-10-CM

## 2019-04-11 DIAGNOSIS — Z7902 Long term (current) use of antithrombotics/antiplatelets: Secondary | ICD-10-CM

## 2019-04-11 DIAGNOSIS — F17200 Nicotine dependence, unspecified, uncomplicated: Secondary | ICD-10-CM

## 2019-04-11 DIAGNOSIS — E1151 Type 2 diabetes mellitus with diabetic peripheral angiopathy without gangrene: Secondary | ICD-10-CM

## 2019-04-11 NOTE — Progress Notes (Signed)
SUBJECTIVE:  Patient ID: Brad Clark, male    DOB: 21-Apr-1956, 63 y.o.   MRN: [id number] Chief Complaint  Patient presents with  . Follow-up    ultrasouns follow up    HPI  Brad Clark is a 63 y.o. male The patient returns to the office for followup and review of the noninvasive studies. There has been a significant deterioration in the lower extremity symptoms.  The patient notes interval shortening of their claudication distance and development of mild rest pain symptoms. No new ulcers or wounds have occurred since the last visit.  The patient states that the right leg is worse than the left.  The patient also states that he has began to have some weakness and giving away of the right lower extremity.  There have been no significant changes to the patient's overall health care.  The patient continues to smoke.  The patient denies amaurosis fugax or recent TIA symptoms. There are no recent neurological changes noted. The patient denies history of DVT, PE or superficial thrombophlebitis. The patient denies recent episodes of angina or shortness of breath.   ABI's Rt=0.78 and Lt=1.01 (previous ABI's Rt= 0.63 and Lt=0.79 at cardiologist office) Duplex US of the lower extremity arterial system shows monophasic waveforms of the right lower extremity from the level of the proximal SFA to the tibial arteries.  The left lower extremity has biphasic waveforms all the way throughout the lower extremity.  The right lower extremity has a 75 to 99% stenosis starting at the proximal SFA.  The mid SFA on the left lower extremity has a 30 to 49% stenosis.  Past Medical History:  Diagnosis Date  . Anginal pain (HCC)   . Coronary artery disease   . GERD (gastroesophageal reflux disease)   . Hypertension   . Shortness of breath dyspnea     Past Surgical History:  Procedure Laterality Date  . BACK SURGERY    . CARDIAC CATHETERIZATION    . CARDIAC CATHETERIZATION N/A 12/05/2015   Procedure: Left Heart Cath and Coronary Angiography;  Surgeon: Dalia HeadingKenneth A Fath, MD;  Location: [facility];  Service: Cardiovascular;  Laterality: N/A;    Social History   Socioeconomic History  . Marital status: Married    Spouse name: Not on file  . Number of children: Not on file  . Years of education: Not on file  . Highest education level: Not on file  Occupational History  . Not on file  Social Needs  . Financial resource strain: Not on file  . Food insecurity    Worry: Not on file    Inability: Not on file  . Transportation needs    Medical: Not on file    Non-medical: Not on file  Tobacco Use  . Smoking status: Former Smoker    Quit date: 12/01/2015    Years since quitting: 3.3  . Smokeless tobacco: Never Used  Substance and Sexual Activity  . Alcohol use: Yes    Alcohol/week: 42.0 standard drinks    Types: 42 Cans of beer per week  . Drug use: Not on file  . Sexual activity: Not on file  Lifestyle  . Physical activity    Days per week: Not on file    Minutes per session: Not on file  . Stress: Not on file  Relationships  . Social Musicianconnections    Talks on phone: Not on file    Gets together: Not on file    Attends religious  service: Not on file    Active member of club or organization: Not on file    Attends meetings of clubs or organizations: Not on file    Relationship status: Not on file  . Intimate partner violence    Fear of current or ex partner: Not on file    Emotionally abused: Not on file    Physically abused: Not on file    Forced sexual activity: Not on file  Other Topics Concern  . Not on file  Social History Narrative  . Not on file    Family History  Problem Relation Age of Onset  . Congenital heart disease Father   . Diabetes Brother     No Known Allergies   Review of Systems   Review of Systems: Negative Unless Checked Constitutional: [] Weight loss  [] Fever  [] Chills Cardiac: [] Chest pain   []  Atrial Fibrillation   [] Palpitations   [] Shortness of breath when laying flat   [] Shortness of breath with exertion. [] Shortness of breath at rest Vascular:  [x] Pain in legs with walking   [] Pain in legs with standing [] Pain in legs when laying flat   [x] Claudication    [] Pain in feet when laying flat    [] History of DVT   [] Phlebitis   [] Swelling in legs   [] Varicose veins   [] Non-healing ulcers Pulmonary:   [] Uses home oxygen   [] Productive cough   [] Hemoptysis   [] Wheeze  [] COPD   [] Asthma Neurologic:  [] Dizziness   [] Seizures  [] Blackouts [] History of stroke   [] History of TIA  [] Aphasia   [] Temporary Blindness   [] Weakness or numbness in arm   [x] Weakness or numbness in leg Musculoskeletal:   [] Joint swelling   [] Joint pain   [] Low back pain  []  History of Knee Replacement [x] Arthritis [] back Surgeries  []  Spinal Stenosis    Hematologic:  [] Easy bruising  [] Easy bleeding   [] Hypercoagulable state   [] Anemic Gastrointestinal:  [] Diarrhea   [] Vomiting  [] Gastroesophageal reflux/heartburn   [] Difficulty swallowing. [] Abdominal pain Genitourinary:  [] Chronic kidney disease   [] Difficult urination  [] Anuric   [] Blood in urine [] Frequent urination  [] Burning with urination   [] Hematuria Skin:  [] Rashes   [] Ulcers [] Wounds Psychological:  [] History of anxiety   []  History of major depression  []  Memory Difficulties      OBJECTIVE:   Physical Exam  BP (!) 151/83 (BP Location: Right Arm)   Pulse 80   Resp 16   Wt 198 lb (89.8 kg)   BMI 29.24 kg/m   Gen: WD/WN, NAD Head: [place]/AT, No temporalis wasting.  Ear/Nose/Throat: Hearing grossly intact, nares w/o erythema or drainage Eyes: PER, EOMI, sclera nonicteric.  Neck: Supple, no masses.  No JVD.  Pulmonary:  Good air movement, no use of accessory muscles.  Cardiac: RRR Vascular:  Vessel Right Left  Radial Palpable Palpable  Dorsalis Pedis  trace palpable Palpable  Posterior Tibial Not Palpable Palpable   Gastrointestinal: soft, non-distended. No guarding/no  peritoneal signs.  Musculoskeletal: M/S 5/5 throughout.  No deformity or atrophy.  Neurologic: Pain and light touch intact in extremities.  Symmetrical.  Speech is fluent. Motor exam as listed above. Psychiatric: Judgment intact, Mood & affect appropriate for pt's clinical situation. Dermatologic: No Venous rashes. No Ulcers Noted.  No changes consistent with cellulitis. Lymph : No Cervical lymphadenopathy, no lichenification or skin changes of chronic lymphedema.       ASSESSMENT AND PLAN:  1. Atherosclerosis of native artery of both lower extremities with intermittent claudication (  HCC) ABI's Rt=0.78 and Lt=1.01 (previous ABI's Rt= 0.63 and Lt=0.79 at cardiologist office) Duplex US of the lower extremity arterial system shows monophasic waveforms of the right lower extremity from the level of the proximal SFA to the tibial arteries.  The left lower extremity has biphasic waveforms all the way throughout the lower extremity.  The right lower extremity has a 75 to 99% stenosis starting at the proximal SFA.  The mid SFA on the left lower extremity has a 30 to 49% stenosis.   Recommend:  The patient has experienced increased symptoms and is now describing lifestyle limiting claudication and mild rest pain.   Given the severity of the patient's lower extremity symptoms the patient should undergo angiography and intervention.  Risk and benefits were reviewed the patient.  Indications for the procedure were reviewed.  All questions were answered, the patient agrees to proceed.   The patient should continue walking and begin a more formal exercise program.  The patient should continue antiplatelet therapy and aggressive treatment of the lipid abnormalities  The patient will follow up with me after the angiogram.   2. Essential (primary) hypertension Continue antihypertensive medications as already ordered, these medications have been reviewed and there are no changes at this time.  3. Type 2  diabetes mellitus with diabetic peripheral angiopathy without gangrene, without long-term current use of insulin (HCC) Continue hypoglycemic medications as already ordered, these medications have been reviewed and there are no changes at this time.  Hgb A1C to be monitored as already arranged by primary service   4. Tobacco use disorder Smoking cessation was discussed, 3-10 minutes spent on this topic specifically    Current Outpatient Medications on File Prior to Visit  Medication Sig Dispense Refill  . aspirin 81 MG tablet Take 81 mg by mouth daily.    . chlorthalidone (HYGROTON) 25 MG tablet Take 25 mg by mouth daily.    . clopidogrel (PLAVIX) 75 MG tablet Take 75 mg by mouth daily.    . isosorbide mononitrate (IMDUR) 60 MG 24 hr tablet Take 1 tablet (60 mg total) by mouth daily. 30 tablet 11  . metFORMIN (GLUCOPHAGE) 500 MG tablet Take 1,000 mg by mouth 2 (two) times daily with a meal.    . metoprolol succinate (TOPROL-XL) 25 MG 24 hr tablet Take 25 mg by mouth daily.    . naproxen sodium (ANAPROX) 220 MG tablet Take 220 mg by mouth daily as needed.    . nitroGLYCERIN (NITROSTAT) 0.4 MG SL tablet Place 0.4 mg under the tongue every 5 (five) minutes as needed for chest pain.    . pantoprazole (PROTONIX) 40 MG tablet Take 40 mg by mouth daily.    . rosuvastatin (CRESTOR) 5 MG tablet Take by mouth.     No current facility-administered medications on file prior to visit.     There are no Patient Instructions on file for this visit. No follow-ups on file.   Georgiana SpinnerFallon E Greenley Martone, NP  This note was completed with Office managerDragon Dictation.  Any errors are purely unintentional.

## 2019-04-11 NOTE — Telephone Encounter (Signed)
Spoke with the patient and he is now scheduled with Dr. Lucky Cowboy for 04/18/2019 with a 8:15 am arrival time. Patient will do his Covid testing on 04/14/2019 between 12:30-2:30 pm. The pre-procedure information will be mailed to the patient.

## 2019-04-12 ENCOUNTER — Other Ambulatory Visit (INDEPENDENT_AMBULATORY_CARE_PROVIDER_SITE_OTHER): Payer: Self-pay | Admitting: Nurse Practitioner

## 2019-04-12 NOTE — Telephone Encounter (Signed)
Patient was called with an amended arrival time for his procedure on 04/18/2019 with Dr. Lucky Cowboy of 8:45 am from 8:15 am. Patient understood.

## 2019-04-14 ENCOUNTER — Other Ambulatory Visit
Admission: RE | Admit: 2019-04-14 | Discharge: 2019-04-14 | Disposition: A | Discharge status: Home / Self Care | Payer: Self-pay | Source: Ambulatory Visit | Attending: Vascular Surgery | Admitting: Vascular Surgery

## 2019-04-14 ENCOUNTER — Other Ambulatory Visit: Payer: Self-pay

## 2019-04-14 DIAGNOSIS — Z20828 Contact with and (suspected) exposure to other viral communicable diseases: Secondary | ICD-10-CM | POA: Insufficient documentation

## 2019-04-15 LAB — SARS CORONAVIRUS 2 (TAT 6-24 HRS): SARS Coronavirus 2: NEGATIVE

## 2019-04-17 MED ORDER — CEFAZOLIN SODIUM-DEXTROSE 2-4 GM/100ML-% IV SOLN
2.0000 g | Freq: Once | INTRAVENOUS | Status: AC
  Administered 2019-04-18: 2 g via INTRAVENOUS

## 2019-04-18 ENCOUNTER — Encounter
Admission: RE | Disposition: A | Discharge status: Home / Self Care | Payer: Self-pay | Source: Home / Self Care | Attending: Vascular Surgery

## 2019-04-18 ENCOUNTER — Other Ambulatory Visit: Payer: Self-pay

## 2019-04-18 ENCOUNTER — Encounter: Payer: Self-pay | Admitting: Vascular Surgery

## 2019-04-18 ENCOUNTER — Ambulatory Visit
Admission: RE | Admit: 2019-04-18 | Discharge: 2019-04-18 | Disposition: A | Discharge status: Home / Self Care | Payer: Self-pay | Attending: Vascular Surgery | Admitting: Vascular Surgery

## 2019-04-18 DIAGNOSIS — I2510 Atherosclerotic heart disease of native coronary artery without angina pectoris: Secondary | ICD-10-CM | POA: Insufficient documentation

## 2019-04-18 DIAGNOSIS — I70223 Atherosclerosis of native arteries of extremities with rest pain, bilateral legs: Secondary | ICD-10-CM

## 2019-04-18 DIAGNOSIS — E1151 Type 2 diabetes mellitus with diabetic peripheral angiopathy without gangrene: Secondary | ICD-10-CM | POA: Insufficient documentation

## 2019-04-18 DIAGNOSIS — K219 Gastro-esophageal reflux disease without esophagitis: Secondary | ICD-10-CM | POA: Insufficient documentation

## 2019-04-18 DIAGNOSIS — Z7984 Long term (current) use of oral hypoglycemic drugs: Secondary | ICD-10-CM | POA: Insufficient documentation

## 2019-04-18 DIAGNOSIS — Z79899 Other long term (current) drug therapy: Secondary | ICD-10-CM | POA: Insufficient documentation

## 2019-04-18 DIAGNOSIS — Z8249 Family history of ischemic heart disease and other diseases of the circulatory system: Secondary | ICD-10-CM | POA: Insufficient documentation

## 2019-04-18 DIAGNOSIS — I10 Essential (primary) hypertension: Secondary | ICD-10-CM | POA: Insufficient documentation

## 2019-04-18 DIAGNOSIS — I70213 Atherosclerosis of native arteries of extremities with intermittent claudication, bilateral legs: Secondary | ICD-10-CM | POA: Insufficient documentation

## 2019-04-18 DIAGNOSIS — Z87891 Personal history of nicotine dependence: Secondary | ICD-10-CM | POA: Insufficient documentation

## 2019-04-18 DIAGNOSIS — Z7982 Long term (current) use of aspirin: Secondary | ICD-10-CM | POA: Insufficient documentation

## 2019-04-18 DIAGNOSIS — I70219 Atherosclerosis of native arteries of extremities with intermittent claudication, unspecified extremity: Secondary | ICD-10-CM

## 2019-04-18 DIAGNOSIS — Z833 Family history of diabetes mellitus: Secondary | ICD-10-CM | POA: Insufficient documentation

## 2019-04-18 DIAGNOSIS — Z7902 Long term (current) use of antithrombotics/antiplatelets: Secondary | ICD-10-CM | POA: Insufficient documentation

## 2019-04-18 HISTORY — PX: LOWER EXTREMITY ANGIOGRAPHY: CATH118251

## 2019-04-18 HISTORY — PX: PERIPHERAL VASCULAR BALLOON ANGIOPLASTY: CATH118281

## 2019-04-18 HISTORY — PX: PERIPHERAL VASCULAR INTERVENTION: CATH118257

## 2019-04-18 LAB — GLUCOSE, CAPILLARY: Glucose-Capillary: 165 mg/dL — ABNORMAL HIGH (ref 70–99)

## 2019-04-18 SURGERY — LOWER EXTREMITY ANGIOGRAPHY
Anesthesia: Moderate Sedation | Laterality: Right

## 2019-04-18 MED ORDER — NITROGLYCERIN 1 MG/10 ML FOR IR/CATH LAB
INTRA_ARTERIAL | Status: DC | PRN
  Administered 2019-04-18 (×2): 200 ug

## 2019-04-18 MED ORDER — MIDAZOLAM HCL 5 MG/5ML IJ SOLN
INTRAMUSCULAR | Status: AC
  Filled 2019-04-18: qty 5

## 2019-04-18 MED ORDER — ONDANSETRON HCL 4 MG/2ML IJ SOLN: 4.0000 mg | Freq: Four times a day (QID) | INTRAMUSCULAR | Status: DC | PRN

## 2019-04-18 MED ORDER — DIPHENHYDRAMINE HCL 50 MG/ML IJ SOLN: 50.0000 mg | Freq: Once | INTRAMUSCULAR | Status: DC | PRN

## 2019-04-18 MED ORDER — FENTANYL CITRATE (PF) 100 MCG/2ML IJ SOLN
INTRAMUSCULAR | Status: AC
  Filled 2019-04-18: qty 2

## 2019-04-18 MED ORDER — FENTANYL CITRATE (PF) 100 MCG/2ML IJ SOLN
INTRAMUSCULAR | Status: DC | PRN
  Administered 2019-04-18: 25 ug via INTRAVENOUS
  Administered 2019-04-18: 50 ug via INTRAVENOUS
  Administered 2019-04-18 (×2): 25 ug via INTRAVENOUS

## 2019-04-18 MED ORDER — HEPARIN SODIUM (PORCINE) 1000 UNIT/ML IJ SOLN
INTRAMUSCULAR | Status: AC
  Filled 2019-04-18: qty 1

## 2019-04-18 MED ORDER — MIDAZOLAM HCL 2 MG/2ML IJ SOLN
INTRAMUSCULAR | Status: DC | PRN
  Administered 2019-04-18 (×2): 1 mg via INTRAVENOUS
  Administered 2019-04-18: 2 mg via INTRAVENOUS
  Administered 2019-04-18: 1 mg via INTRAVENOUS

## 2019-04-18 MED ORDER — LIDOCAINE-EPINEPHRINE (PF) 1 %-1:200000 IJ SOLN
INTRAMUSCULAR | Status: DC | PRN
  Administered 2019-04-18: 10 mL via INTRADERMAL

## 2019-04-18 MED ORDER — MIDAZOLAM HCL 2 MG/ML PO SYRP: 8.0000 mg | ORAL_SOLUTION | Freq: Once | ORAL | Status: DC | PRN

## 2019-04-18 MED ORDER — METHYLPREDNISOLONE SODIUM SUCC 125 MG IJ SOLR: 125.0000 mg | Freq: Once | INTRAMUSCULAR | Status: DC | PRN

## 2019-04-18 MED ORDER — CEFAZOLIN SODIUM-DEXTROSE 2-4 GM/100ML-% IV SOLN
INTRAVENOUS | Status: AC
  Filled 2019-04-18: qty 100

## 2019-04-18 MED ORDER — SODIUM CHLORIDE 0.9 % IV SOLN
INTRAVENOUS | Status: DC
  Administered 2019-04-18: 1000 mL via INTRAVENOUS

## 2019-04-18 MED ORDER — FAMOTIDINE 20 MG PO TABS: 40.0000 mg | ORAL_TABLET | Freq: Once | ORAL | Status: DC | PRN

## 2019-04-18 MED ORDER — HEPARIN SODIUM (PORCINE) 1000 UNIT/ML IJ SOLN
INTRAMUSCULAR | Status: DC | PRN
  Administered 2019-04-18: 5000 [IU] via INTRAVENOUS

## 2019-04-18 MED ORDER — IODIXANOL 320 MG/ML IV SOLN
INTRAVENOUS | Status: DC | PRN
  Administered 2019-04-18: 145 mL via INTRA_ARTERIAL

## 2019-04-18 MED ORDER — HYDROMORPHONE HCL 1 MG/ML IJ SOLN: 1.0000 mg | Freq: Once | INTRAMUSCULAR | Status: DC | PRN

## 2019-04-18 SURGICAL SUPPLY — 20 items
BALLN LUTONIX 018 5X300X130 (BALLOONS) ×3
BALLN ULTRVRSE 3.5X100X150 (BALLOONS) ×3
BALLN ULTRVRSE 3X150X150 (BALLOONS) ×3
BALLOON LUTONIX 018 5X300X130 (BALLOONS) IMPLANT
BALLOON ULTRVRSE 3.5X100X150 (BALLOONS) IMPLANT
BALLOON ULTRVRSE 3X150X150 (BALLOONS) IMPLANT
CATH BEACON 5 .038 100 VERT TP (CATHETERS) ×2 IMPLANT
CATH PIG 70CM (CATHETERS) ×2 IMPLANT
COVER PROBE U/S 5X48 (MISCELLANEOUS) ×2 IMPLANT
DEVICE PRESTO INFLATION (MISCELLANEOUS) ×2 IMPLANT
DEVICE STARCLOSE SE CLOSURE (Vascular Products) ×2 IMPLANT
GLIDEWIRE ADV .035X180CM (WIRE) ×2 IMPLANT
PACK ANGIOGRAPHY (CUSTOM PROCEDURE TRAY) ×3 IMPLANT
SHEATH ANL2 6FRX45 HC (SHEATH) ×2 IMPLANT
SHEATH BRITE TIP 5FRX11 (SHEATH) ×2 IMPLANT
SHIELD RADPAD DADD DRAPE 4X9 (MISCELLANEOUS) ×2 IMPLANT
STENT VIABAHN 6X250X120 (Permanent Stent) ×2 IMPLANT
TUBING CONTRAST HIGH PRESS 72 (TUBING) ×2 IMPLANT
WIRE G V18X300CM (WIRE) ×2 IMPLANT
WIRE J 3MM .035X145CM (WIRE) ×2 IMPLANT

## 2019-04-18 NOTE — Progress Notes (Signed)
Patient remains clinically stable post lower extremity angiogram, vitals stable, Dr Lucky Cowboy out to speak with patient with questions answered. No bleeding nor hematoma at left groin site. Denies complaints. Discharge instructions given to patient and wife with questions answered.

## 2019-04-18 NOTE — Discharge Instructions (Signed)
Moderate Conscious Sedation, Adult, Care After °These instructions provide you with information about caring for yourself after your procedure. Your health care provider may also give you more specific instructions. Your treatment has been planned according to current medical practices, but problems sometimes occur. Call your health care provider if you have any problems or questions after your procedure. °What can I expect after the procedure? °After your procedure, it is common: °· To feel sleepy for several hours. °· To feel clumsy and have poor balance for several hours. °· To have poor judgment for several hours. °· To vomit if you eat too soon. °Follow these instructions at home: °For at least 24 hours after the procedure: ° °· Do not: °? Participate in activities where you could fall or become injured. °? Drive. °? Use heavy machinery. °? Drink alcohol. °? Take sleeping pills or medicines that cause drowsiness. °? Make important decisions or sign legal documents. °? Take care of children on your own. °· Rest. °Eating and drinking °· Follow the diet recommended by your health care provider. °· If you vomit: °? Drink water, juice, or soup when you can drink without vomiting. °? Make sure you have little or no nausea before eating solid foods. °General instructions °· Have a responsible adult stay with you until you are awake and alert. °· Take over-the-counter and prescription medicines only as told by your health care provider. °· If you smoke, do not smoke without supervision. °· Keep all follow-up visits as told by your health care provider. This is important. °Contact a health care provider if: °· You keep feeling nauseous or you keep vomiting. °· You feel light-headed. °· You develop a rash. °· You have a fever. °Get help right away if: °· You have trouble breathing. °This information is not intended to replace advice given to you by your health care provider. Make sure you discuss any questions you have  with your health care provider. °Document Released: 06/22/2013 Document Revised: 08/14/2017 Document Reviewed: 12/22/2015 °Elsevier Patient Education © 2020 [employer] °Femoral Site Care °This sheet gives you information about how to care for yourself after your procedure. Your health care provider may also give you more specific instructions. If you have problems or questions, contact your health care provider. °What can I expect after the procedure? °After the procedure, it is common to have: °· Bruising that usually fades within 1-2 weeks. °· Tenderness at the site. °Follow these instructions at home: °Wound care °· Follow instructions from your health care provider about how to take care of your insertion site. Make sure you: °? Wash your hands with soap and water before you change your bandage (dressing). If soap and water are not available, use hand sanitizer. °? Change your dressing as told by your health care provider. °? Leave stitches (sutures), skin glue, or adhesive strips in place. These skin closures may need to stay in place for 2 weeks or longer. If adhesive strip edges start to loosen and curl up, you may trim the loose edges. Do not remove adhesive strips completely unless your health care provider tells you to do that. °· Do not take baths, swim, or use a hot tub until your health care provider approves. °· You may shower 24-48 hours after the procedure or as told by your health care provider. °? Gently wash the site with plain soap and water. °? Pat the area dry with a clean towel. °? Do not rub the site. This may cause bleeding. °·   Do not apply powder or lotion to the site. Keep the site clean and dry. °· Check your femoral site every day for signs of infection. Check for: °? Redness, swelling, or pain. °? Fluid or blood. °? Warmth. °? Pus or a bad smell. °Activity °· For the first 2-3 days after your procedure, or as long as directed: °? Avoid climbing stairs as much as possible. °? Do not  squat. °· Do not lift anything that is heavier than 10 lb (4.5 kg), or the limit that you are told, until your health care provider says that it is safe. °· Rest as directed. °? Avoid sitting for a long time without moving. Get up to take short walks every 1-2 hours. °· Do not drive for 24 hours if you were given a medicine to help you relax (sedative). °General instructions °· Take over-the-counter and prescription medicines only as told by your health care provider. °· Keep all follow-up visits as told by your health care provider. This is important. °Contact a health care provider if you have: °· A fever or chills. °· You have redness, swelling, or pain around your insertion site. °Get help right away if: °· The catheter insertion area swells very fast. °· You pass out. °· You suddenly start to sweat or your skin gets clammy. °· The catheter insertion area is bleeding, and the bleeding does not stop when you hold steady pressure on the area. °· The area near or just beyond the catheter insertion site becomes pale, cool, tingly, or numb. °These symptoms may represent a serious problem that is an emergency. Do not wait to see if the symptoms will go away. Get medical help right away. Call your local emergency services (911 in the [place] Do not drive yourself to the hospital. °Summary °· After the procedure, it is common to have bruising that usually fades within 1-2 weeks. °· Check your femoral site every day for signs of infection. °· Do not lift anything that is heavier than 10 lb (4.5 kg), or the limit that you are told, until your health care provider says that it is safe. °This information is not intended to replace advice given to you by your health care provider. Make sure you discuss any questions you have with your health care provider. °Document Released: 05/05/2014 Document Revised: 09/14/2017 Document Reviewed: 09/14/2017 °Elsevier Patient Education © 2020 [employer] ° °

## 2019-04-18 NOTE — Op Note (Signed)
[facility]  Percutaneous Study/Intervention Procedural Note   Date of Surgery: 04/18/2019  Surgeon(s):Alyxandra Tenbrink    Assistants:none  Pre-operative Diagnosis: PAD with claudication bilateral lower extremities right greater than left  Post-operative diagnosis:  Same  Procedure(s) Performed:             1.  Ultrasound guidance for vascular access left femoral artery             2.  Catheter placement into right common femoral artery from left femoral approach             3.  Aortogram and selective right lower extremity angiogram             4.  Percutaneous transluminal angioplasty of right posterior tibial artery with 3 and 3.5 mm diameter angioplasty balloons             5.   Percutaneous transluminal angioplasty of the right SFA with a 5 mm diameter by 30 cm length Lutonix drug-coated angioplasty balloon  6.  Percutaneous transluminal angioplasty of the right anterior tibial artery with 3 and 3.5 mm diameter angioplasty balloons  7.  Viabahn stent placement to the right SFA with a 6 mm diameter by 25 cm length stent for multiple areas of greater than 50% stenosis after angioplasty             8.  StarClose closure device left femoral artery  EBL: 5 cc  Contrast: 145 cc  Fluoro Time: 14.2 minutes  Moderate Conscious Sedation Time: approximately 60 minutes using 5 mg of Versed and 125 mcg of Fentanyl              Indications:  Patient is a 63 y.o.male with disabling claudication symptoms worse on the right than the left. The patient has noninvasive study showing significantly reduced right ABI with reasonably normal left ABI. The patient is brought in for angiography for further evaluation and potential treatment.  Due to the limb threatening nature of the situation, angiogram was performed for attempted limb salvage. The patient is aware that if the procedure fails, amputation would be expected.  The patient also understands that even with successful  revascularization, amputation may still be required due to the severity of the situation.  Risks and benefits are discussed and informed consent is obtained.   Procedure:  The patient was identified and appropriate procedural time out was performed.  The patient was then placed supine on the table and prepped and draped in the usual sterile fashion. Moderate conscious sedation was administered during a face to face encounter with the patient throughout the procedure with my supervision of the RN administering medicines and monitoring the patient's vital signs, pulse oximetry, telemetry and mental status throughout from the start of the procedure until the patient was taken to the recovery room. Ultrasound was used to evaluate the left common femoral artery.  It was patent .  A digital ultrasound image was acquired.  A Seldinger needle was used to access the left common femoral artery under direct ultrasound guidance and a permanent image was performed.  A 0.035 J wire was advanced without resistance and a 5Fr sheath was placed.  Pigtail catheter was placed into the aorta and an AP aortogram was performed. This demonstrated left renal artery stenosis of >70%, no right renal artery stenosis.  The aorta was patent.  The left common iliac artery had some degree of stenosis that appeared to be in the moderate range of 50  to 60% although not high-grade.  No other iliac stenosis was identified. I then crossed the aortic bifurcation and advanced to the right femoral head. Selective right lower extremity angiogram was then performed. This demonstrated diffuse disease of the right SFA with near occlusive stenosis in the midsegment and 70% stenosis proximally and 80 to 90% stenosis distally.  The popliteal artery normalized reasonably well and there was a high takeoff of the anterior tibial artery just below the knee and a long tibioperoneal trunk.  The anterior tibial artery had about an 80 to 90% stenosis in the proximal  segment about 7 to 8 cm beyond the origin and then was continuous to the foot.  The peroneal artery was small without obvious focal stenosis.  The posterior tibial artery also had about a 90% stenosis in its proximal segment only a few centimeters beyond its origin and then was continuous to the foot. It was felt that it was in the patient's best interest to proceed with intervention after these images to avoid a second procedure and a larger amount of contrast and fluoroscopy based off of the findings from the initial angiogram. The patient was systemically heparinized and a 6 [place] Ansell sheath was then placed over the [employer] wire. I then used a Kumpe catheter and the advantage wire to navigate through the SFA disease and get into the tibioperoneal trunk.  Selective imaging was then performed to better evaluate the posterior tibial stenosis which confirmed the roughly 90% stenosis in the proximal segment.  I then crossed this without difficulty with a V 18 wire and the Kumpe catheter and parked the wire in the foot.  A 3 mm diameter by 15 cm length angioplasty balloon was then inflated to 8 atm for 1 minute.  There was a suboptimal result and a 3.5 mm diameter angioplasty balloon was inflated to 6 atm for 1 minute with excellent angiographic completion result and less than 20% residual stenosis.  I then pulled the catheter back to the popliteal artery and directed down into the proximal anterior tibial artery.  Again, selective imaging was used through the Kumpe catheter to help navigate through the stenosis and evaluate the proximal stenosis.  The high-grade proximal stenosis was crossed without difficulty with a V 18 wire.  A 3 mm diameter angioplasty balloon was inflated to 8 atm for 1 minute.  Again, there remained significant stenosis and I upsized to a 3.5 mm diameter balloon and inflated this to 8 atm for 1 minute and the proximal anterior tibial artery.  Completion imaging showed markedly  improved flow with only about a 20 to 25% residual stenosis.  I then turned my attention to the SFA.  5 mm diameter by 30 cm length Lutonix drug-coated angioplasty balloon was used to treat from the proximal SFA to Hunter's canal.  This was inflated to 8 atm for 1 minute.  Completion imaging showed suboptimal result with multiple areas of greater than 50% residual stenosis and I elected to cover this with a stent.  A 6 mm diameter by 25 cm length Viabahn stent was then deployed starting about 7 to 8 cm from the origin of the SFA and going down to Hunter's canal.  This was postdilated with a 5 mm balloon with excellent angiographic completion result and less than 10% residual stenosis. I elected to terminate the procedure. The sheath was removed and StarClose closure device was deployed in the left femoral artery with excellent hemostatic result. The patient was taken to  the recovery room in stable condition having tolerated the procedure well.  Findings:               Aortogram:  left renal artery stenosis of >70%, no right renal artery stenosis.  The aorta was patent.  The left common iliac artery had some degree of stenosis that appeared to be in the moderate range of 50 to 60% although not high-grade.  No other iliac stenosis was identified.             Right Lower Extremity:  diffuse disease of the right SFA with near occlusive stenosis in the midsegment and 70% stenosis proximally and 80 to 90% stenosis distally.  The popliteal artery normalized reasonably well and there was a high takeoff of the anterior tibial artery just below the knee and a long tibioperoneal trunk.  The anterior tibial artery had about an 80 to 90% stenosis in the proximal segment about 7 to 8 cm beyond the origin and then was continuous to the foot.  The peroneal artery was small without obvious focal stenosis.  The posterior tibial artery also had about a 90% stenosis in its proximal segment only a few centimeters beyond its origin  and then was continuous to the foot.   Disposition: Patient was taken to the recovery room in stable condition having tolerated the procedure well.  Complications: None  Leotis Pain 04/18/2019 10:55 AM   This note was created with Dragon Medical transcription system. Any errors in dictation are purely unintentional.

## 2019-04-18 NOTE — H&P (Signed)
[facility] History & Physical Update  The patient was interviewed and re-examined.  The patient's previous History and Physical has been reviewed and is unchanged.  There is no change in the plan of care. We plan to proceed with the scheduled procedure.  Leotis Pain, MD  04/18/2019, 8:50 AM

## 2019-05-13 ENCOUNTER — Other Ambulatory Visit (INDEPENDENT_AMBULATORY_CARE_PROVIDER_SITE_OTHER): Payer: Self-pay | Admitting: Vascular Surgery

## 2019-05-13 DIAGNOSIS — Z95820 Peripheral vascular angioplasty status with implants and grafts: Secondary | ICD-10-CM

## 2019-05-17 ENCOUNTER — Encounter (INDEPENDENT_AMBULATORY_CARE_PROVIDER_SITE_OTHER): Payer: Self-pay | Admitting: Nurse Practitioner

## 2019-05-17 ENCOUNTER — Other Ambulatory Visit: Payer: Self-pay

## 2019-05-17 ENCOUNTER — Ambulatory Visit (INDEPENDENT_AMBULATORY_CARE_PROVIDER_SITE_OTHER): Payer: Self-pay

## 2019-05-17 ENCOUNTER — Ambulatory Visit (INDEPENDENT_AMBULATORY_CARE_PROVIDER_SITE_OTHER): Payer: Self-pay | Admitting: Vascular Surgery

## 2019-05-17 VITALS — BP 146/84 | HR 91 | Resp 16 | Ht 69.0 in | Wt 194.0 lb

## 2019-05-17 DIAGNOSIS — M549 Dorsalgia, unspecified: Secondary | ICD-10-CM | POA: Insufficient documentation

## 2019-05-17 DIAGNOSIS — K219 Gastro-esophageal reflux disease without esophagitis: Secondary | ICD-10-CM | POA: Insufficient documentation

## 2019-05-17 DIAGNOSIS — Z95820 Peripheral vascular angioplasty status with implants and grafts: Secondary | ICD-10-CM

## 2019-05-17 DIAGNOSIS — E1151 Type 2 diabetes mellitus with diabetic peripheral angiopathy without gangrene: Secondary | ICD-10-CM

## 2019-05-17 DIAGNOSIS — I2510 Atherosclerotic heart disease of native coronary artery without angina pectoris: Secondary | ICD-10-CM

## 2019-05-17 DIAGNOSIS — I10 Essential (primary) hypertension: Secondary | ICD-10-CM

## 2019-05-17 DIAGNOSIS — E785 Hyperlipidemia, unspecified: Secondary | ICD-10-CM | POA: Insufficient documentation

## 2019-05-17 DIAGNOSIS — M4802 Spinal stenosis, cervical region: Secondary | ICD-10-CM | POA: Insufficient documentation

## 2019-05-17 DIAGNOSIS — F17200 Nicotine dependence, unspecified, uncomplicated: Secondary | ICD-10-CM

## 2019-05-17 DIAGNOSIS — I70213 Atherosclerosis of native arteries of extremities with intermittent claudication, bilateral legs: Secondary | ICD-10-CM

## 2019-05-17 NOTE — Patient Instructions (Signed)
Peripheral Vascular Disease  Peripheral vascular disease (PVD) is a disease of the blood vessels that are not part of your heart and brain. A simple term for PVD is poor circulation. In most cases, PVD narrows the blood vessels that carry blood from your heart to the rest of your body. This can reduce the supply of blood to your arms, legs, and internal organs, like your stomach or kidneys. However, PVD most often affects a person's lower legs and feet. Without treatment, PVD tends to get worse. PVD can also lead to acute ischemic limb. This is when an arm or leg suddenly cannot get enough blood. This is a medical emergency. Follow these instructions at home: Lifestyle  Do not use any products that contain nicotine or tobacco, such as cigarettes and e-cigarettes. If you need help quitting, ask your doctor.  Lose weight if you are overweight. Or, stay at a healthy weight as told by your doctor.  Eat a diet that is low in fat and cholesterol. If you need help, ask your doctor.  Exercise regularly. Ask your doctor for activities that are right for you. General instructions  Take over-the-counter and prescription medicines only as told by your doctor.  Take good care of your feet: ? Wear comfortable shoes that fit well. ? Check your feet often for any cuts or sores.  Keep all follow-up visits as told by your doctor This is important. Contact a doctor if:  You have cramps in your legs when you walk.  You have leg pain when you are at rest.  You have coldness in a leg or foot.  Your skin changes.  You are unable to get or have an erection (erectile dysfunction).  You have cuts or sores on your feet that do not heal. Get help right away if:  Your arm or leg turns cold, numb, and blue.  Your arms or legs become red, warm, swollen, painful, or numb.  You have chest pain.  You have trouble breathing.  You suddenly have weakness in your face, arm, or leg.  You become very  confused or you cannot speak.  You suddenly have a very bad headache.  You suddenly cannot see. Summary  Peripheral vascular disease (PVD) is a disease of the blood vessels.  A simple term for PVD is poor circulation. Without treatment, PVD tends to get worse.  Treatment may include exercise, low fat and low cholesterol diet, and quitting smoking. This information is not intended to replace advice given to you by your health care provider. Make sure you discuss any questions you have with your health care provider. Document Released: 11/26/2009 Document Revised: 08/14/2017 Document Reviewed: 10/09/2016 Elsevier Patient Education  2020 Elsevier Inc.  

## 2019-05-17 NOTE — Progress Notes (Signed)
MRN : [id number]  Macky LowerRonald G Holzschuh is a 63 y.o. (1956/06/10) male who presents with chief complaint of  Chief Complaint  Patient presents with  . Follow-up    [facility] 4week post angio  .  History of Present Illness: Patient returns today in follow up of his PAD.  He underwent revascularization which was somewhat extensive in the right lower extremity about a month ago.  He has had marked improvement in his claudication symptoms since that time.  He had some postprocedural pain for about a week but once this improved he felt much better.  He has had what sounds like a gout flare in his left great toe that has been somewhat improved with colchicine.  Reperfusion swelling was minimal.  No access related problems.  His noninvasive studies today show a marked improvement in his right ABI up into the normal range at 1.02 with a normal digital pressure of 113.  His left ABI is stable at 0.86 with mildly reduced waveforms and a toe pressure of 70.  He briefly quit smoking, but has started back smoking again  Current Outpatient Medications  Medication Sig Dispense Refill  . aspirin 81 MG tablet Take 81 mg by mouth daily.    . chlorthalidone (HYGROTON) 25 MG tablet Take 25 mg by mouth daily.    . clopidogrel (PLAVIX) 75 MG tablet Take 75 mg by mouth daily.    . isosorbide mononitrate (IMDUR) 60 MG 24 hr tablet Take 1 tablet (60 mg total) by mouth daily. 30 tablet 11  . metFORMIN (GLUCOPHAGE) 500 MG tablet Take 1,000 mg by mouth 2 (two) times daily with a meal.    . metoprolol succinate (TOPROL-XL) 25 MG 24 hr tablet Take 25 mg by mouth daily.    . naproxen sodium (ANAPROX) 220 MG tablet Take 220 mg by mouth daily as needed.    . nitroGLYCERIN (NITROSTAT) 0.4 MG SL tablet Place 0.4 mg under the tongue every 5 (five) minutes as needed for chest pain.    . pantoprazole (PROTONIX) 40 MG tablet Take 40 mg by mouth daily.    . rosuvastatin (CRESTOR) 5 MG tablet Take by mouth.     No current  facility-administered medications for this visit.     Past Medical History:  Diagnosis Date  . Anginal pain (HCC)   . Coronary artery disease   . GERD (gastroesophageal reflux disease)   . Hypertension   . Shortness of breath dyspnea     Past Surgical History:  Procedure Laterality Date  . BACK SURGERY    . CARDIAC CATHETERIZATION    . CARDIAC CATHETERIZATION N/A 12/05/2015   Procedure: Left Heart Cath and Coronary Angiography;  Surgeon: Dalia HeadingKenneth A Fath, MD;  Location: [facility] INVASIVE CV LAB;  Service: Cardiovascular;  Laterality: N/A;  . LOWER EXTREMITY ANGIOGRAPHY Right 04/18/2019   Procedure: LOWER EXTREMITY ANGIOGRAPHY;  Surgeon: Annice Needyew, Jason S, MD;  Location: [facility] INVASIVE CV LAB;  Service: Cardiovascular;  Laterality: Right;  . PERIPHERAL VASCULAR BALLOON ANGIOPLASTY Right 04/18/2019   Procedure: PERIPHERAL VASCULAR BALLOON ANGIOPLASTY;  Surgeon: Annice Needyew, Jason S, MD;  Location: [facility] INVASIVE CV LAB;  Service: Cardiovascular;  Laterality: Right;  Right PT & Right AT  . PERIPHERAL VASCULAR INTERVENTION Right 04/18/2019   Procedure: PERIPHERAL VASCULAR INTERVENTION;  Surgeon: Annice Needyew, Jason S, MD;  Location: [facility] INVASIVE CV LAB;  Service: Cardiovascular;  Laterality: Right;  Right SFA    Social History Everyday smoker Drinks roughly a sixpack of beer daily No IV drug  use   Family History  Problem Relation Age of Onset  . Congenital heart disease Father   . Diabetes Brother   no bleeding or clotting disordes  No Known Allergies   REVIEW OF SYSTEMS (Negative unless checked)  Constitutional: [] ?Weight loss  [] ?Fever  [] ?Chills Cardiac: [] ?Chest pain   [] ?Chest pressure   [] ?Palpitations   [] ?Shortness of breath when laying flat   [] ?Shortness of breath at rest   [] ?Shortness of breath with exertion. Vascular:  [x] ?Pain in legs with walking   [] ?Pain in legs at rest   [] ?Pain in legs when laying flat   [x] ?Claudication   [] ?Pain in feet when walking  [] ?Pain in feet at rest  [] ?Pain in  feet when laying flat   [] ?History of DVT   [] ?Phlebitis   [] ?Swelling in legs   [] ?Varicose veins   [] ?Non-healing ulcers Pulmonary:   [] ?Uses home oxygen   [] ?Productive cough   [] ?Hemoptysis   [] ?Wheeze  [] ?COPD   [] ?Asthma Neurologic:  [] ?Dizziness  [] ?Blackouts   [] ?Seizures   [] ?History of stroke   [] ?History of TIA  [] ?Aphasia   [] ?Temporary blindness   [] ?Dysphagia   [] ?Weakness or numbness in arms   [] ?Weakness or numbness in legs Musculoskeletal:  [x] ?Arthritis   [] ?Joint swelling   [x] ?Joint pain   [] ?Low back pain Hematologic:  [] ?Easy bruising  [] ?Easy bleeding   [] ?Hypercoagulable state   [] ?Anemic  [] ?Hepatitis Gastrointestinal:  [] ?Blood in stool   [] ?Vomiting blood  [x] ?Gastroesophageal reflux/heartburn   [] ?Abdominal pain Genitourinary:  [] ?Chronic kidney disease   [] ?Difficult urination  [] ?Frequent urination  [] ?Burning with urination   [] ?Hematuria Skin:  [] ?Rashes   [] ?Ulcers   [] ?Wounds Psychological:  [] ?History of anxiety   [] ? History of major depression.  Physical Examination  BP (!) 146/84 (BP Location: Right Arm)   Pulse 91   Resp 16   Ht 5\' 9"  (1.753 m)   Wt 194 lb (88 kg)   BMI 28.65 kg/m  Gen:  WD/WN, NAD Head: [place]/AT, No temporalis wasting. Ear/Nose/Throat: Hearing grossly intact, nares w/o erythema or drainage Eyes: Conjunctiva clear. Sclera non-icteric Neck: Supple.  Trachea midline Pulmonary:  Good air movement, no use of accessory muscles.  Cardiac: RRR, no JVD Vascular:  Vessel Right Left  Radial Palpable Palpable                          PT Palpable  1+ palpable  DP Palpable  1+ palpable   Gastrointestinal: soft, non-tender/non-distended. No guarding/reflex.  Musculoskeletal: M/S 5/5 throughout.  No deformity or atrophy.  No lower extremity edema. Neurologic: Sensation grossly intact in extremities.  Symmetrical.  Speech is fluent.  Psychiatric: Judgment intact, Mood & affect appropriate for pt's clinical situation. Dermatologic: No  rashes or ulcers noted.  No cellulitis or open wounds.       Labs Recent Results (from the past 2160 hour(s))  SARS Coronavirus 2 (Performed [facility])     Status: None   Collection Time: 04/14/19 12:32 PM   Specimen: Nasal Swab  Result Value Ref Range   SARS Coronavirus 2 NEGATIVE NEGATIVE    Comment: (NOTE) SARS-CoV-2 target nucleic acids are NOT DETECTED. The SARS-CoV-2 RNA is generally detectable in upper and lower respiratory specimens during the acute phase of infection. Negative results do not preclude SARS-CoV-2 infection, do not rule out co-infections with other pathogens, and should not be used as the sole basis for treatment or other patient management decisions. Negative  results must be combined with clinical observations, patient history, and epidemiological information. The expected result is Negative. Fact Sheet for Patients: SugarRoll.be Fact Sheet for Healthcare Providers: https://www.woods-mathews.com/ This test is not yet approved or cleared by the [place] FDA and  has been authorized for detection and/or diagnosis of SARS-CoV-2 by FDA under an Emergency Use Authorization (EUA). This EUA will remain  in effect (meaning this test can be used) for the duration of the COVID-19 declaration under Section 56 4(b)(1) of the Act, 21 U.S.C. section 360bbb-3(b)(1), unless the authorization is terminated or revoked sooner. Performed at [facility], [place] [address], [place], [place] [address]   Glucose, capillary     Status: Abnormal   Collection Time: 04/18/19  8:43 AM  Result Value Ref Range   Glucose-Capillary 165 (H) 70 - 99 mg/dL    Radiology Burnard Bunting With/wo Tbi  Result Date: 05/17/2019 LOWER EXTREMITY DOPPLER STUDY Indications: Peripheral artery disease.  Vascular Interventions: 04/18/2019 PTA of right PTA and ATA. PTA and stent of                         right SFA. Comparison Study: 04/11/2019  Performing Technologist: Charlane Ferretti RT (R)(VS)  Examination Guidelines: A complete evaluation includes at minimum, Doppler waveform signals and systolic blood pressure reading at the level of bilateral brachial, anterior tibial, and posterior tibial arteries, when vessel segments are accessible. Bilateral testing is considered an integral part of a complete examination. Photoelectric Plethysmograph (PPG) waveforms and toe systolic pressure readings are included as required and additional duplex testing as needed. Limited examinations for reoccurring indications may be performed as noted.  ABI Findings: +---------+------------------+-----+---------+--------+ Right    Rt Pressure (mmHg)IndexWaveform Comment  +---------+------------------+-----+---------+--------+ Brachial 165                                      +---------+------------------+-----+---------+--------+ ATA      160               0.97 triphasic         +---------+------------------+-----+---------+--------+ PTA      169               1.02 triphasic         +---------+------------------+-----+---------+--------+ Great Toe113               0.68 Normal            +---------+------------------+-----+---------+--------+ +---------+------------------+-----+--------+-------+ Left     Lt Pressure (mmHg)IndexWaveformComment +---------+------------------+-----+--------+-------+ Brachial 165                                    +---------+------------------+-----+--------+-------+ ATA      97                0.59 biphasic        +---------+------------------+-----+--------+-------+ PTA      142               0.86                 +---------+------------------+-----+--------+-------+ Great Toe70                0.42 Abnormal        +---------+------------------+-----+--------+-------+ +-------+-----------+-----------+------------+------------+ ABI/TBIToday's ABIToday's TBIPrevious ABIPrevious TBI  +-------+-----------+-----------+------------+------------+ Right  1.02       .68        .  78         .41          +-------+-----------+-----------+------------+------------+ Left   .86        .42        1.01        .47          +-------+-----------+-----------+------------+------------+ Right ABIs appear increased compared to prior study on 04/11/2019. Left ABIs appear decreased compared to prior study on 04/11/2019. Summary: Right: Resting right ankle-brachial index is within normal range. No evidence of significant right lower extremity arterial disease. The right toe-brachial index is mildly abnormal. Right TBI appears slightly increased from the previous exam on 04/11/2019. Left: Resting left ankle-brachial index indicates moderate left lower extremity arterial disease. The left toe-brachial index is abnormal. Left TBI appears essentially unchanged from the previous exam on 04/11/2019. *See table(s) above for measurements and observations.  Electronically signed by Festus Barren MD on 05/17/2019 at 3:45:14 PM.   Final     Assessment/Plan Essential (primary) hypertension blood pressure control important in reducing the progression of atherosclerotic disease. On appropriate oral medications.   Diabetes (HCC) blood glucose control important in reducing the progression of atherosclerotic disease. Also, involved in wound healing. On appropriate medications.   CAD in native artery Continue cardiac and antihypertensive medications as already ordered and reviewed, no changes at this time. Continue statin as ordered and reviewed, no changes at this time Nitrates PRN for chest pain  Tobacco use disorder We had a discussion for approximately 3-4 minutes regarding the absolute need for smoking cessation due to the deleterious nature of tobacco on the vascular system. We discussed the tobacco use would diminish patency of any intervention, and likely significantly worsen progressio of disease. We  discussed multiple agents for quitting including replacement therapy or medications to reduce cravings such as Chantix. The patient voices their understanding of the importance of smoking cessation.   Atherosclerosis of native arteries of extremity with intermittent claudication (HCC) His noninvasive studies today show a marked improvement in his right ABI up into the normal range at 1.02 with a normal digital pressure of 113.  His left ABI is stable at 0.86 with mildly reduced waveforms and a toe pressure of 70.  We discussed that some of his left great toe symptoms could be related to perfusion, but his perfusion is not bad on that side.  His right leg symptom is markedly improved as is his perfusion pressure on studies today.  Smoking cessation was again stressed.  Continue current medical regimen.  We discussed intervention on the left leg versus a short interval follow-up of 3 months and he would prefer the latter which is certainly reasonable.  We will see him back at that time    Festus Barren, MD  05/17/2019 4:06 PM    This note was created with Dragon medical transcription system.  Any errors from dictation are purely unintentional

## 2019-05-17 NOTE — Assessment & Plan Note (Signed)
His noninvasive studies today show a marked improvement in his right ABI up into the normal range at 1.02 with a normal digital pressure of 113.  His left ABI is stable at 0.86 with mildly reduced waveforms and a toe pressure of 70.  We discussed that some of his left great toe symptoms could be related to perfusion, but his perfusion is not bad on that side.  His right leg symptom is markedly improved as is his perfusion pressure on studies today.  Smoking cessation was again stressed.  Continue current medical regimen.  We discussed intervention on the left leg versus a short interval follow-up of 3 months and he would prefer the latter which is certainly reasonable.  We will see him back at that time

## 2019-05-17 NOTE — Assessment & Plan Note (Signed)
We had a discussion for approximately 3-4 minutes regarding the absolute need for smoking cessation due to the deleterious nature of tobacco on the vascular system. We discussed the tobacco use would diminish patency of any intervention, and likely significantly worsen progressio of disease. We discussed multiple agents for quitting including replacement therapy or medications to reduce cravings such as Chantix. The patient voices their understanding of the importance of smoking cessation.  

## 2019-08-17 ENCOUNTER — Encounter (INDEPENDENT_AMBULATORY_CARE_PROVIDER_SITE_OTHER): Payer: Self-pay

## 2019-08-17 ENCOUNTER — Ambulatory Visit (INDEPENDENT_AMBULATORY_CARE_PROVIDER_SITE_OTHER): Payer: Self-pay | Admitting: Nurse Practitioner

## 2019-09-30 ENCOUNTER — Encounter (INDEPENDENT_AMBULATORY_CARE_PROVIDER_SITE_OTHER): Payer: Self-pay

## 2019-09-30 ENCOUNTER — Telehealth (INDEPENDENT_AMBULATORY_CARE_PROVIDER_SITE_OTHER): Payer: Self-pay

## 2019-09-30 ENCOUNTER — Ambulatory Visit (INDEPENDENT_AMBULATORY_CARE_PROVIDER_SITE_OTHER): Payer: Self-pay

## 2019-09-30 ENCOUNTER — Other Ambulatory Visit: Payer: Self-pay

## 2019-09-30 ENCOUNTER — Other Ambulatory Visit
Admission: RE | Admit: 2019-09-30 | Discharge: 2019-09-30 | Disposition: A | Discharge status: Home / Self Care | Payer: HRSA Program | Source: Ambulatory Visit | Attending: Vascular Surgery | Admitting: Vascular Surgery

## 2019-09-30 ENCOUNTER — Other Ambulatory Visit (INDEPENDENT_AMBULATORY_CARE_PROVIDER_SITE_OTHER): Payer: Self-pay | Admitting: Nurse Practitioner

## 2019-09-30 ENCOUNTER — Ambulatory Visit (INDEPENDENT_AMBULATORY_CARE_PROVIDER_SITE_OTHER): Payer: Self-pay | Admitting: Vascular Surgery

## 2019-09-30 ENCOUNTER — Encounter (INDEPENDENT_AMBULATORY_CARE_PROVIDER_SITE_OTHER): Payer: Self-pay | Admitting: Vascular Surgery

## 2019-09-30 VITALS — BP 148/75 | HR 74 | Resp 16 | Wt 194.6 lb

## 2019-09-30 DIAGNOSIS — I10 Essential (primary) hypertension: Secondary | ICD-10-CM

## 2019-09-30 DIAGNOSIS — Z20822 Contact with and (suspected) exposure to covid-19: Secondary | ICD-10-CM | POA: Diagnosis not present

## 2019-09-30 DIAGNOSIS — I2510 Atherosclerotic heart disease of native coronary artery without angina pectoris: Secondary | ICD-10-CM

## 2019-09-30 DIAGNOSIS — F17200 Nicotine dependence, unspecified, uncomplicated: Secondary | ICD-10-CM

## 2019-09-30 DIAGNOSIS — I70229 Atherosclerosis of native arteries of extremities with rest pain, unspecified extremity: Secondary | ICD-10-CM

## 2019-09-30 DIAGNOSIS — E1151 Type 2 diabetes mellitus with diabetic peripheral angiopathy without gangrene: Secondary | ICD-10-CM

## 2019-09-30 DIAGNOSIS — E785 Hyperlipidemia, unspecified: Secondary | ICD-10-CM

## 2019-09-30 DIAGNOSIS — I70213 Atherosclerosis of native arteries of extremities with intermittent claudication, bilateral legs: Secondary | ICD-10-CM

## 2019-09-30 DIAGNOSIS — Z01812 Encounter for preprocedural laboratory examination: Secondary | ICD-10-CM | POA: Insufficient documentation

## 2019-09-30 NOTE — Assessment & Plan Note (Signed)
His ABI today has dropped to 0.39 on the right which is down from 1.0 only 4 months ago.  His left ABI stable at 0.90. This is a difficult drop and now represents a limb threatening situation with rest pain.  I have recommended that we proceed with angiogram.  With Covid restrictions this likely will not be able to be done for a couple of days until we get his Covid test back, but we will put this on the schedule for the next available opportunity.  He will continue his medications.  Smoking cessation was strongly recommended.  Discussed the grave nature of the situation and he voices his understanding

## 2019-09-30 NOTE — Telephone Encounter (Signed)
Patient was seen today and scheduled for a right leg angio with Dr. Wyn Quaker on 10/03/19 with a 10;00 am arrival time to the MM. Patient will do his covid testing on 09/30/19 before 11:00 am. Pre-procedure instructions were discussed and given to the patient as well.

## 2019-09-30 NOTE — Progress Notes (Signed)
MRN : [id number]  Brad Clark is a 64 y.o. ([birth year]) male who presents with chief complaint of  Chief Complaint  Patient presents with  . Follow-up    66month u/s for rle pain,numbness,cold  .  History of Present Illness: Patient returns today in follow up of his PAD.  He was doing well until earlier this week when he began developing pain and numbness in the right foot and lower leg.  He does continue to smoke and understands that he needs to quit.  He is trying to cut back.  The right leg has been weak and painful.  His ABI today has dropped to 0.39 on the right which is down from 1.0 only 4 months ago.  His left ABI stable at 0.90.  Current Outpatient Medications  Medication Sig Dispense Refill  . aspirin 81 MG tablet Take 81 mg by mouth daily.    . chlorthalidone (HYGROTON) 25 MG tablet Take 25 mg by mouth daily.    . clopidogrel (PLAVIX) 75 MG tablet Take 75 mg by mouth daily.    . isosorbide mononitrate (IMDUR) 60 MG 24 hr tablet Take 1 tablet (60 mg total) by mouth daily. 30 tablet 11  . metFORMIN (GLUCOPHAGE) 500 MG tablet Take 1,000 mg by mouth 2 (two) times daily with a meal.    . metoprolol succinate (TOPROL-XL) 25 MG 24 hr tablet Take 25 mg by mouth daily.    . naproxen sodium (ANAPROX) 220 MG tablet Take 220 mg by mouth daily as needed.    . nitroGLYCERIN (NITROSTAT) 0.4 MG SL tablet Place 0.4 mg under the tongue every 5 (five) minutes as needed for chest pain.    . pantoprazole (PROTONIX) 40 MG tablet Take 40 mg by mouth daily.    . rosuvastatin (CRESTOR) 5 MG tablet Take by mouth.     No current facility-administered medications for this visit.    Past Medical History:  Diagnosis Date  . Anginal pain ([place])   . Coronary artery disease   . GERD (gastroesophageal reflux disease)   . Hypertension   . Shortness of breath dyspnea     Past Surgical History:  Procedure Laterality Date  . BACK SURGERY    . CARDIAC CATHETERIZATION    . CARDIAC  CATHETERIZATION N/A 12/05/2015   Procedure: Left Heart Cath and Coronary Angiography;  Surgeon: Teodoro Spray, MD;  Location: [facility];  Service: Cardiovascular;  Laterality: N/A;  . LOWER EXTREMITY ANGIOGRAPHY Right 04/18/2019   Procedure: LOWER EXTREMITY ANGIOGRAPHY;  Surgeon: Algernon Huxley, MD;  Location: [facility];  Service: Cardiovascular;  Laterality: Right;  . PERIPHERAL VASCULAR BALLOON ANGIOPLASTY Right 04/18/2019   Procedure: PERIPHERAL VASCULAR BALLOON ANGIOPLASTY;  Surgeon: Algernon Huxley, MD;  Location: [facility];  Service: Cardiovascular;  Laterality: Right;  Right PT & Right AT  . PERIPHERAL VASCULAR INTERVENTION Right 04/18/2019   Procedure: PERIPHERAL VASCULAR INTERVENTION;  Surgeon: Algernon Huxley, MD;  Location: [facility];  Service: Cardiovascular;  Laterality: Right;  Right SFA    Social History Everyday smoker Drinks roughly a sixpack of beer daily No IV drug use        Family History  Problem Relation Age of Onset  . Congenital heart disease Father   . Diabetes Brother   no bleeding or clotting disordes  No Known Allergies   REVIEW OF SYSTEMS(Negative unless checked)  Constitutional: [] ??Weight loss[] ??Fever[] ??Chills Cardiac:[] ??Chest pain[] ??Chest pressure[] ??Palpitations [] ??Shortness of breath when laying  flat [] ??Shortness of breath at rest [] ??Shortness of breath with exertion. Vascular: [x] ??Pain in legs with walking[] ??Pain in legsat rest[] ??Pain in legs when laying flat [x] ??Claudication [] ??Pain in feet when walking [] ??Pain in feet at rest [] ??Pain in feet when laying flat [] ??History of DVT [] ??Phlebitis [] ??Swelling in legs [] ??Varicose veins [] ??Non-healing ulcers Pulmonary: [] ??Uses home oxygen [] ??Productive cough[] ??Hemoptysis [] ??Wheeze [] ??COPD [] ??Asthma Neurologic: [] ??Dizziness [] ??Blackouts [] ??Seizures [] ??History of stroke [] ??History  of TIA[] ??Aphasia [] ??Temporary blindness[] ??Dysphagia [] ??Weaknessor numbness in arms [x] ??Weakness or numbnessin legs Musculoskeletal: [x] ??Arthritis [] ??Joint swelling [x] ??Joint pain [] ??Low back pain Hematologic:[] ??Easy bruising[] ??Easy bleeding [] ??Hypercoagulable state [] ??Anemic [] ??Hepatitis Gastrointestinal:[] ??Blood in stool[] ??Vomiting blood[x] ??Gastroesophageal reflux/heartburn[] ??Abdominal pain Genitourinary: [] ??Chronic kidney disease [] ??Difficulturination [] ??Frequenturination [] ??Burning with urination[] ??Hematuria Skin: [] ??Rashes [] ??Ulcers [] ??Wounds Psychological: [] ??History of anxiety[] ??History of major depression.    Physical Examination  BP (!) 148/75 (BP Location: Right Arm)   Pulse 74   Resp 16   Wt 194 lb 9.6 oz (88.3 kg)   BMI 28.74 kg/m  Gen:  WD/WN, NAD Head: [place]/AT, No temporalis wasting. Ear/Nose/Throat: Hearing grossly intact, nares w/o erythema or drainage Eyes: Conjunctiva clear. Sclera non-icteric Neck: Supple.  Trachea midline Pulmonary:  Good air movement, no use of accessory muscles.  Cardiac: RRR, no JVD Vascular:  Vessel Right Left  Radial Palpable Palpable                          PT  not palpable Palpable  DP  not palpable Palpable    Musculoskeletal: M/S 5/5 throughout.  No deformity or atrophy.  Trace right lower extremity edema. Neurologic: Sensation grossly intact in extremities.  Symmetrical.  Speech is fluent.  Psychiatric: Judgment intact, Mood & affect appropriate for pt's clinical situation. Dermatologic: No rashes or ulcers noted.  No cellulitis or open wounds.       Labs No results found for this or any previous visit (from the past 2160 hour(s)).  Radiology No results found.  Assessment/Plan Essential (primary) hypertension blood pressure control important in reducing the progression of atherosclerotic disease. On appropriate oral  medications.   Diabetes (HCC) blood glucose control important in reducing the progression of atherosclerotic disease. Also, involved in wound healing. On appropriate medications.   CAD in native artery Continue cardiac and antihypertensive medications as already ordered and reviewed, no changes at this time. Continue statin as ordered and reviewed, no changes at this time Nitrates PRN for chest pain  Tobacco use disorder We had a discussion for approximately 3-4 minutes regarding the absolute need for smoking cessation due to the deleterious nature of tobacco on the vascular system. We discussed the tobacco use would diminish patency of any intervention, and likely significantly worsen progressio of disease. We discussed multiple agents for quitting including replacement therapy or medications to reduce cravings such as Chantix. The patient voices their understanding of the importance of smoking cessation.  Atherosclerosis of artery of extremity with rest pain (HCC) His ABI today has dropped to 0.39 on the right which is down from 1.0 only 4 months ago.  His left ABI stable at 0.90. This is a difficult drop and now represents a limb threatening situation with rest pain.  I have recommended that we proceed with angiogram.  With Covid restrictions this likely will not be able to be done for a couple of days until we get his Covid test back, but we will put this on the schedule for the next available opportunity.  He will continue his medications.  Smoking cessation was strongly recommended.  Discussed the grave  nature of the situation and he voices his understanding    Festus Barren, MD  09/30/2019 10:29 AM    This note was created with Dragon medical transcription system.  Any errors from dictation are purely unintentional

## 2019-09-30 NOTE — Patient Instructions (Signed)
Peripheral Vascular Disease  Peripheral vascular disease (PVD) is a disease of the blood vessels that are not part of your heart and brain. A simple term for PVD is poor circulation. In most cases, PVD narrows the blood vessels that carry blood from your heart to the rest of your body. This can reduce the supply of blood to your arms, legs, and internal organs, like your stomach or kidneys. However, PVD most often affects a person's lower legs and feet. Without treatment, PVD tends to get worse. PVD can also lead to acute ischemic limb. This is when an arm or leg suddenly cannot get enough blood. This is a medical emergency. Follow these instructions at home: Lifestyle  Do not use any products that contain nicotine or tobacco, such as cigarettes and e-cigarettes. If you need help quitting, ask your doctor.  Lose weight if you are overweight. Or, stay at a healthy weight as told by your doctor.  Eat a diet that is low in fat and cholesterol. If you need help, ask your doctor.  Exercise regularly. Ask your doctor for activities that are right for you. General instructions  Take over-the-counter and prescription medicines only as told by your doctor.  Take good care of your feet: ? Wear comfortable shoes that fit well. ? Check your feet often for any cuts or sores.  Keep all follow-up visits as told by your doctor This is important. Contact a doctor if:  You have cramps in your legs when you walk.  You have leg pain when you are at rest.  You have coldness in a leg or foot.  Your skin changes.  You are unable to get or have an erection (erectile dysfunction).  You have cuts or sores on your feet that do not heal. Get help right away if:  Your arm or leg turns cold, numb, and blue.  Your arms or legs become red, warm, swollen, painful, or numb.  You have chest pain.  You have trouble breathing.  You suddenly have weakness in your face, arm, or leg.  You become very  confused or you cannot speak.  You suddenly have a very bad headache.  You suddenly cannot see. Summary  Peripheral vascular disease (PVD) is a disease of the blood vessels.  A simple term for PVD is poor circulation. Without treatment, PVD tends to get worse.  Treatment may include exercise, low fat and low cholesterol diet, and quitting smoking. This information is not intended to replace advice given to you by your health care provider. Make sure you discuss any questions you have with your health care provider. Document Revised: 08/14/2017 Document Reviewed: 10/09/2016 Elsevier Patient Education  2020 [employer]  

## 2019-10-01 LAB — SARS CORONAVIRUS 2 (TAT 6-24 HRS): SARS Coronavirus 2: NEGATIVE

## 2019-10-03 ENCOUNTER — Other Ambulatory Visit: Payer: Self-pay

## 2019-10-03 ENCOUNTER — Encounter: Payer: Self-pay | Admitting: Vascular Surgery

## 2019-10-03 ENCOUNTER — Encounter
Admission: RE | Disposition: A | Discharge status: Home / Self Care | Payer: Self-pay | Source: Home / Self Care | Attending: Vascular Surgery

## 2019-10-03 ENCOUNTER — Ambulatory Visit
Admission: RE | Admit: 2019-10-03 | Discharge: 2019-10-03 | Disposition: A | Discharge status: Home / Self Care | Payer: Self-pay | Attending: Vascular Surgery | Admitting: Vascular Surgery

## 2019-10-03 DIAGNOSIS — K219 Gastro-esophageal reflux disease without esophagitis: Secondary | ICD-10-CM | POA: Insufficient documentation

## 2019-10-03 DIAGNOSIS — Z7982 Long term (current) use of aspirin: Secondary | ICD-10-CM | POA: Insufficient documentation

## 2019-10-03 DIAGNOSIS — I70221 Atherosclerosis of native arteries of extremities with rest pain, right leg: Secondary | ICD-10-CM

## 2019-10-03 DIAGNOSIS — Z79899 Other long term (current) drug therapy: Secondary | ICD-10-CM | POA: Insufficient documentation

## 2019-10-03 DIAGNOSIS — E1151 Type 2 diabetes mellitus with diabetic peripheral angiopathy without gangrene: Secondary | ICD-10-CM | POA: Insufficient documentation

## 2019-10-03 DIAGNOSIS — I25110 Atherosclerotic heart disease of native coronary artery with unstable angina pectoris: Secondary | ICD-10-CM | POA: Insufficient documentation

## 2019-10-03 DIAGNOSIS — I10 Essential (primary) hypertension: Secondary | ICD-10-CM | POA: Insufficient documentation

## 2019-10-03 DIAGNOSIS — F17210 Nicotine dependence, cigarettes, uncomplicated: Secondary | ICD-10-CM | POA: Insufficient documentation

## 2019-10-03 DIAGNOSIS — Z7902 Long term (current) use of antithrombotics/antiplatelets: Secondary | ICD-10-CM | POA: Insufficient documentation

## 2019-10-03 DIAGNOSIS — I998 Other disorder of circulatory system: Secondary | ICD-10-CM

## 2019-10-03 HISTORY — DX: Peripheral vascular disease, unspecified: I73.9

## 2019-10-03 HISTORY — PX: LOWER EXTREMITY ANGIOGRAPHY: CATH118251

## 2019-10-03 LAB — CREATININE, SERUM
Creatinine, Ser: 1.2 mg/dL (ref 0.61–1.24)
GFR calc Af Amer: >60 mL/min (ref >60)
GFR calc non Af Amer: >60 mL/min (ref >60)

## 2019-10-03 LAB — GLUCOSE, CAPILLARY
Glucose-Capillary: 149 mg/dL — ABNORMAL HIGH (ref 70–99)
Glucose-Capillary: 142 mg/dL — ABNORMAL HIGH (ref 70–99)

## 2019-10-03 LAB — BUN: BUN: 15 mg/dL (ref 8–23)

## 2019-10-03 SURGERY — LOWER EXTREMITY ANGIOGRAPHY
Anesthesia: Moderate Sedation | Site: Leg Lower | Laterality: Right

## 2019-10-03 MED ORDER — FENTANYL CITRATE (PF) 100 MCG/2ML IJ SOLN
INTRAMUSCULAR | Status: AC
  Filled 2019-10-03: qty 2

## 2019-10-03 MED ORDER — ALTEPLASE 2 MG IJ SOLR
INTRAMUSCULAR | Status: DC | PRN
  Administered 2019-10-03: 4 mg

## 2019-10-03 MED ORDER — MIDAZOLAM HCL 5 MG/5ML IJ SOLN
INTRAMUSCULAR | Status: AC
  Filled 2019-10-03: qty 5

## 2019-10-03 MED ORDER — DIPHENHYDRAMINE HCL 50 MG/ML IJ SOLN
INTRAMUSCULAR | Status: DC | PRN
  Administered 2019-10-03: 50 mg via INTRAVENOUS

## 2019-10-03 MED ORDER — MIDAZOLAM HCL 2 MG/ML PO SYRP: 8.0000 mg | ORAL_SOLUTION | Freq: Once | ORAL | Status: DC | PRN

## 2019-10-03 MED ORDER — HEPARIN SODIUM (PORCINE) 1000 UNIT/ML IJ SOLN
INTRAMUSCULAR | Status: DC | PRN
  Administered 2019-10-03: 5000 [IU] via INTRAVENOUS

## 2019-10-03 MED ORDER — DIPHENHYDRAMINE HCL 50 MG/ML IJ SOLN: 50.0000 mg | Freq: Once | INTRAMUSCULAR | Status: DC | PRN

## 2019-10-03 MED ORDER — HYDRALAZINE HCL 20 MG/ML IJ SOLN: 5.0000 mg | INTRAMUSCULAR | Status: DC | PRN

## 2019-10-03 MED ORDER — CEFAZOLIN SODIUM-DEXTROSE 2-4 GM/100ML-% IV SOLN
INTRAVENOUS | Status: AC
  Administered 2019-10-03: 2 g via INTRAVENOUS
  Filled 2019-10-03: qty 100

## 2019-10-03 MED ORDER — SODIUM CHLORIDE 0.9 % IV SOLN: INTRAVENOUS | Status: DC

## 2019-10-03 MED ORDER — HEPARIN SODIUM (PORCINE) 1000 UNIT/ML IJ SOLN
INTRAMUSCULAR | Status: AC
  Filled 2019-10-03: qty 1

## 2019-10-03 MED ORDER — ACETAMINOPHEN 325 MG PO TABS: 650.0000 mg | ORAL_TABLET | ORAL | Status: DC | PRN

## 2019-10-03 MED ORDER — HYDROMORPHONE HCL 1 MG/ML IJ SOLN: 1.0000 mg | Freq: Once | INTRAMUSCULAR | Status: DC | PRN

## 2019-10-03 MED ORDER — LABETALOL HCL 5 MG/ML IV SOLN: 10.0000 mg | INTRAVENOUS | Status: DC | PRN

## 2019-10-03 MED ORDER — ONDANSETRON HCL 4 MG/2ML IJ SOLN: 4.0000 mg | Freq: Four times a day (QID) | INTRAMUSCULAR | Status: DC | PRN

## 2019-10-03 MED ORDER — SODIUM CHLORIDE 0.9% FLUSH: 3.0000 mL | Freq: Two times a day (BID) | INTRAVENOUS | Status: DC

## 2019-10-03 MED ORDER — SODIUM CHLORIDE 0.9 % IV SOLN: 250.0000 mL | INTRAVENOUS | Status: DC | PRN

## 2019-10-03 MED ORDER — METHYLPREDNISOLONE SODIUM SUCC 125 MG IJ SOLR: 125.0000 mg | Freq: Once | INTRAMUSCULAR | Status: DC | PRN

## 2019-10-03 MED ORDER — SODIUM CHLORIDE 0.9% FLUSH: 3.0000 mL | INTRAVENOUS | Status: DC | PRN

## 2019-10-03 MED ORDER — FENTANYL CITRATE (PF) 100 MCG/2ML IJ SOLN
INTRAMUSCULAR | Status: DC | PRN
  Administered 2019-10-03 (×3): 50 ug via INTRAVENOUS

## 2019-10-03 MED ORDER — MIDAZOLAM HCL 2 MG/2ML IJ SOLN
INTRAMUSCULAR | Status: DC | PRN
  Administered 2019-10-03 (×2): 1 mg via INTRAVENOUS
  Administered 2019-10-03: 2 mg via INTRAVENOUS

## 2019-10-03 MED ORDER — DIPHENHYDRAMINE HCL 50 MG/ML IJ SOLN
INTRAMUSCULAR | Status: AC
  Filled 2019-10-03: qty 1

## 2019-10-03 MED ORDER — CEFAZOLIN SODIUM-DEXTROSE 2-4 GM/100ML-% IV SOLN: 2.0000 g | Freq: Once | INTRAVENOUS | Status: AC

## 2019-10-03 MED ORDER — FAMOTIDINE 20 MG PO TABS: 40.0000 mg | ORAL_TABLET | Freq: Once | ORAL | Status: DC | PRN

## 2019-10-03 SURGICAL SUPPLY — 24 items
BALLN LUTONIX 018 5X300X130 (BALLOONS) ×3
BALLN ULTRVRSE 3X150X150 (BALLOONS) ×3
BALLN ULTRVRSE 5X150X150 (BALLOONS) ×3
BALLOON LUTONIX 018 5X300X130 (BALLOONS) ×1 IMPLANT
BALLOON ULTRVRSE 3X150X150 (BALLOONS) ×1 IMPLANT
BALLOON ULTRVRSE 5X150X150 (BALLOONS) ×1 IMPLANT
CANISTER PENUMBRA ENGINE (MISCELLANEOUS) ×3 IMPLANT
CATH BEACON 5 .038 100 VERT TP (CATHETERS) ×3 IMPLANT
CATH CXI SUPP ANG 4FR 135 (CATHETERS) ×1 IMPLANT
CATH CXI SUPP ANG 4FR 135CM (CATHETERS) ×3
CATH INDIGO CAT6 KIT (CATHETERS) ×3 IMPLANT
CATH INDIGO SEP 6 (CATHETERS) ×3 IMPLANT
CATH PIG 70CM (CATHETERS) ×3 IMPLANT
DEVICE PRESTO INFLATION (MISCELLANEOUS) ×3 IMPLANT
DEVICE STARCLOSE SE CLOSURE (Vascular Products) ×3 IMPLANT
GLIDEWIRE ADV .035X260CM (WIRE) ×3 IMPLANT
PACK ANGIOGRAPHY (CUSTOM PROCEDURE TRAY) ×3 IMPLANT
SHEATH BRITE TIP 5FRX11 (SHEATH) ×3 IMPLANT
SHEATH PINNACLE ST 6F 45CM (SHEATH) ×3 IMPLANT
STENT VIABAHN 6X100X120 (Permanent Stent) ×6 IMPLANT
SYR MEDRAD MARK 7 150ML (SYRINGE) ×3 IMPLANT
TUBING CONTRAST HIGH PRESS 72 (TUBING) ×3 IMPLANT
WIRE G V18X300CM (WIRE) ×3 IMPLANT
WIRE J 3MM .035X145CM (WIRE) ×3 IMPLANT

## 2019-10-03 NOTE — Progress Notes (Signed)
Dr. Wyn Quaker in at bedside, speaking with pt. & wife re: procedural results. Both verbalize understanding of conversation. Post op instructions reviewed with pt with nodded understanding.

## 2019-10-03 NOTE — H&P (Signed)
[facility] History & Physical Update  The patient was interviewed and re-examined.  The patient's previous History and Physical has been reviewed and is unchanged.  There is no change in the plan of care. We plan to proceed with the scheduled procedure.  Festus Barren, MD  10/03/2019, 10:03 AM

## 2019-10-03 NOTE — Op Note (Signed)
[facility]  Percutaneous Study/Intervention Procedural Note   Date of Surgery: 10/03/2019  Surgeon(s):Levada Bowersox    Assistants:none  Pre-operative Diagnosis: PAD with rest pain right foot  Post-operative diagnosis:  Same  Procedure(s) Performed:             1.  Ultrasound guidance for vascular access left femoral artery             2.  Catheter placement into right common femoral artery from left femoral approach             3.  Aortogram and selective right lower extremity angiogram including a selective image of the right posterior tibial artery             4.  Percutaneous transluminal angioplasty of right posterior tibial artery with 3 mm diameter angioplasty balloon             5.   Catheter directed thrombolytic therapy with 4 mg of TPA in the right SFA and popliteal arteries  6.  Mechanical thrombectomy with the penumbra cat 6 device to the right SFA, popliteal artery, tibioperoneal trunk, proximal peroneal artery  7.  Percutaneous transluminal angioplasty of the right SFA and popliteal arteries with 2 inflations with a 5 mm diameter by 30 cm length angioplasty balloon  8.  Viabahn stent placement to the right popliteal artery with 6 mm diameter by 10 cm length stent for greater than 50% residual stenosis and thrombus after above procedure  9.  Viabahn stent placement to the right proximal SFA with 6 mm diameter by 10 cm length stent for greater than 50% residual stenosis and thrombus after above procedures             10.  StarClose closure device left femoral artery  EBL: 250 cc  Contrast: 120 cc  Fluoro Time: 17.4 minutes  Moderate Conscious Sedation Time: approximately 50 minutes using 4 mg of Versed and 150 mcg of Fentanyl              Indications:  Patient is a 64 y.o.male with a 1 week history of recurrent rest pain symptoms of the right foot.  3 months ago, his ABI was normal on the right foot this is markedly dropped to 0.4. The patient is  brought in for angiography for further evaluation and potential treatment.  Due to the limb threatening nature of the situation, angiogram was performed for attempted limb salvage. The patient is aware that if the procedure fails, amputation would be expected.  The patient also understands that even with successful revascularization, amputation may still be required due to the severity of the situation.  Risks and benefits are discussed and informed consent is obtained.   Procedure:  The patient was identified and appropriate procedural time out was performed.  The patient was then placed supine on the table and prepped and draped in the usual sterile fashion. Moderate conscious sedation was administered during a face to face encounter with the patient throughout the procedure with my supervision of the RN administering medicines and monitoring the patient's vital signs, pulse oximetry, telemetry and mental status throughout from the start of the procedure until the patient was taken to the recovery room. Ultrasound was used to evaluate the left common femoral artery.  It was patent .  A digital ultrasound image was acquired.  A Seldinger needle was used to access the left common femoral artery under direct ultrasound guidance and a permanent image was performed.  A 0.035 J wire was advanced without resistance and a 5Fr sheath was placed.  Pigtail catheter was placed into the aorta and an AP aortogram was performed. The renal arteries were not well seen but this was likely due to catheter position.  The aorta was patent.  The right iliac system did not have any significant stenosis.  The left common iliac artery had a 50 to 60% stenosis but he currently was not having symptoms on the left leg so I did not treat this.  The left external iliac artery had no significant stenosis. I then crossed the aortic bifurcation and advanced to the right femoral head. Selective right lower extremity angiogram was then performed.  This demonstrated a short stump of the right SFA which was patent before it abruptly occluded.  The previously placed stent was occluded.  There was reconstitution of the above-knee popliteal artery but distal flow was fairly faint.  There appeared to be two-vessel runoff distally with the peroneal and the posterior tibial artery although the anterior tibial artery may have also reconstituted distally.  The posterior tibial artery appeared to be the dominant runoff to the foot but initial image quality was very poor due to sluggish flow. It was felt that it was in the patient's best interest to proceed with intervention after these images to avoid a second procedure and a larger amount of contrast and fluoroscopy based off of the findings from the initial angiogram. The patient was systemically heparinized and a 6 [place] destination sheath was then placed over the Terumo Advantage wire. I then used a Kumpe catheter and the advantage wire to easily navigate through the SFA and popliteal occlusions.  Catheter was advanced into the posterior tibial artery and selective imaging was performed which was found to have 80 to 90% stenosis over about an 8 to 10 cm segment in the proximal portion and then was continuous with the best runoff into the foot.  I then instilled 4 mg of TPA through the Kumpe catheter in the right SFA and popliteal arteries.  This was allowed to dwell for several minutes.  Mechanical thrombectomy was then performed with multiple passes with the penumbra cat 6 device to the right SFA and popliteal arteries with significant chunks of thrombus removed.  After the majority of the thrombus was removed, there is clearly a high-grade stenosis at the distal edge of the stent in the proximal popliteal artery as well as at least moderate to high-grade stenosis at the proximal edge of the stent in the proximal SFA.  I used a 5 mm diameter by 30 cm length Lutonix drug-coated angioplasty balloon to treat from  just above the knee in the popliteal artery up to the proximal SFA with 2 inflations up to 10 to 12 atm.  The posterior tibial artery stenosis was treated with a 3 mm diameter by 15 cm length angioplasty balloon inflated to 10 atm for 1 minute.  Following this, there were still greater than 50% stenosis just above the previously placed stent and just below the previously placed stent as well as thrombus present.  I elected to cover these areas with covered stents.  A 6 mm diameter by 10 cm length Viabahn stent was chosen for both the proximal SFA as well as the above-knee popliteal artery.  Both were deployed and postdilated with 5 mm balloons.  The proximal portion was now widely patent in the SFA but the above-knee popliteal artery still had a blob of thrombus now at  the distal edge of the new stent.  I then advanced the penumbra cat 6 device down into the popliteal artery and ran it through the popliteal artery, tibioperoneal trunk, and proximal peroneal artery including use of the separator in these locations.  Completion imaging showed no residual thrombus with resolution of the thrombus and less than 10% residual stenosis in the popliteal artery.  The posterior tibial artery angioplasty site was also markedly improved with only about a 20 to 25% residual stenosis.  There was now good flow distally and I felt we had markedly improved his perfusion at this point.  I elected to terminate the procedure. The sheath was removed and StarClose closure device was deployed in the left femoral artery with excellent hemostatic result. The patient was taken to the recovery room in stable condition having tolerated the procedure well.  Findings:               Aortogram:  The renal arteries were not well seen but this was likely due to catheter position.  The aorta was patent.  The right iliac system did not have any significant stenosis.  The left common iliac artery had a 50 to 60% stenosis but he currently was not  having symptoms on the left leg so I did not treat this.  The left external iliac artery had no significant stenosis.             Right lower Extremity:  This demonstrated a short stump of the right SFA which was patent before it abruptly occluded.  The previously placed stent was occluded.  There was reconstitution of the above-knee popliteal artery but distal flow was fairly faint.  There appeared to be two-vessel runoff distally with the peroneal and the posterior tibial artery although the anterior tibial artery may have also reconstituted distally.  The posterior tibial artery appeared to be the dominant runoff to the foot but initial image quality was very poor due to sluggish flow.  On later selective imaging of the posterior tibial artery there was found to be an 80 to 90% stenosis in the proximal portion of the vessel.   Disposition: Patient was taken to the recovery room in stable condition having tolerated the procedure well.  Complications: None  Leotis Pain 10/03/2019 1:08 PM   This note was created with Dragon Medical transcription system. Any errors in dictation are purely unintentional.

## 2019-10-04 ENCOUNTER — Encounter: Payer: Self-pay | Admitting: Cardiology

## 2019-10-11 ENCOUNTER — Telehealth (INDEPENDENT_AMBULATORY_CARE_PROVIDER_SITE_OTHER): Payer: Self-pay

## 2019-10-11 NOTE — Telephone Encounter (Signed)
Patient has been made aware with medical advice and verbalized understanding 

## 2019-10-11 NOTE — Telephone Encounter (Signed)
The patient had a significant lack of blood flow prior to the procedure and sometimes due to this the foot will feel numb from lack of blood flow.  Once it is restored, the numbness persists somewhat because the nerves need time to recover.  However, in some instances, permanent damage has been done and full sensation may never be restored. As long as the foot is warm and there isn't any severe pain we are ok.  Also, if the foot is swelling there can be some numbness so elevation is advised.

## 2019-10-11 NOTE — Telephone Encounter (Signed)
I left a message on patient voicemail to return a call back to the office 

## 2019-10-19 ENCOUNTER — Other Ambulatory Visit (INDEPENDENT_AMBULATORY_CARE_PROVIDER_SITE_OTHER): Payer: Self-pay | Admitting: Vascular Surgery

## 2019-10-19 DIAGNOSIS — Z95820 Peripheral vascular angioplasty status with implants and grafts: Secondary | ICD-10-CM

## 2019-10-24 ENCOUNTER — Encounter (INDEPENDENT_AMBULATORY_CARE_PROVIDER_SITE_OTHER): Payer: Self-pay | Admitting: Nurse Practitioner

## 2019-10-24 ENCOUNTER — Ambulatory Visit (INDEPENDENT_AMBULATORY_CARE_PROVIDER_SITE_OTHER): Payer: Self-pay

## 2019-10-24 ENCOUNTER — Ambulatory Visit (INDEPENDENT_AMBULATORY_CARE_PROVIDER_SITE_OTHER): Payer: Self-pay | Admitting: Nurse Practitioner

## 2019-10-24 ENCOUNTER — Other Ambulatory Visit: Payer: Self-pay

## 2019-10-24 VITALS — BP 149/79 | HR 77 | Resp 12 | Ht 69.0 in | Wt 195.0 lb

## 2019-10-24 DIAGNOSIS — E785 Hyperlipidemia, unspecified: Secondary | ICD-10-CM

## 2019-10-24 DIAGNOSIS — I10 Essential (primary) hypertension: Secondary | ICD-10-CM

## 2019-10-24 DIAGNOSIS — Z8669 Personal history of other diseases of the nervous system and sense organs: Secondary | ICD-10-CM | POA: Insufficient documentation

## 2019-10-24 DIAGNOSIS — Z95820 Peripheral vascular angioplasty status with implants and grafts: Secondary | ICD-10-CM

## 2019-10-24 DIAGNOSIS — E1169 Type 2 diabetes mellitus with other specified complication: Secondary | ICD-10-CM

## 2019-10-24 DIAGNOSIS — I70229 Atherosclerosis of native arteries of extremities with rest pain, unspecified extremity: Secondary | ICD-10-CM

## 2019-10-24 MED ORDER — APIXABAN 5 MG PO TABS: 5.0000 mg | ORAL_TABLET | Freq: Two times a day (BID) | ORAL | 0 refills | Status: DC

## 2019-10-25 ENCOUNTER — Encounter (INDEPENDENT_AMBULATORY_CARE_PROVIDER_SITE_OTHER): Payer: Self-pay | Admitting: Nurse Practitioner

## 2019-10-25 NOTE — Progress Notes (Signed)
SUBJECTIVE:  Patient ID: Brad Clark, male    DOB: September 24, 1955, 64 y.o.   MRN: [id number] Chief Complaint  Patient presents with  . Follow-up    3 week F/U     HPI  Brad Clark is a 65 y.o. 64 y.o. male that presents today after angiogram done on 10/03/2019.  The patient had extensive intervention done including:  1. Ultrasound guidance for vascular access left femoral artery 2. Catheter placement into right common femoral artery from left femoral approach 3. Aortogram and selective right lower extremity angiogram including a selective image of the right posterior tibial artery 4. Percutaneous transluminal angioplasty of right posterior tibial artery with 3 mm diameter angioplasty balloon 5.  Catheter directed thrombolytic therapy with 4 mg of TPA in the right SFA and popliteal arteries             6.  Mechanical thrombectomy with the penumbra cat 6 device to the right SFA, popliteal artery, tibioperoneal trunk, proximal peroneal artery             7.  Percutaneous transluminal angioplasty of the right SFA and popliteal arteries with 2 inflations with a 5 mm diameter by 30 cm length angioplasty balloon             8.  Viabahn stent placement to the right popliteal artery with 6 mm diameter by 10 cm length stent for greater than 50% residual stenosis and thrombus after above procedure             9.  Viabahn stent placement to the right proximal SFA with 6 mm diameter by 10 cm length stent for greater than 50% residual stenosis and thrombus after above procedures 10. StarClose closure device left femoral artery  Today, the patient does report that the numbness and discomfort that he was having in his foot is much less however he still continues to have some numbness specifically near the toes.  He denies any rest pain like symptoms.  He does endorse that overall his leg feels better however it still not quite normal.   He denies any fever, chills, nausea, vomiting or diarrhea.  The patient has also been compliant with taking his aspirin and Plavix.  The patient is also abstaining from tobacco use.  Today the patient had a right ABI of 0.99 with a left of 0.76.  His previous ABI was 0.39 with his left of 0.90 on 09/30/2019.  Today, the patient had a TBI of 0 on the right and a TBI 0.56 on the left.  Prior to intervention the patient's TBI was 0 on the right with a TBI 0.56 on the left.  The patient did not have palpable pulses on his right foot however these pulses were heard with a hand-held Doppler Past Medical History:  Diagnosis Date  . Anginal pain ([place])   . Coronary artery disease   . GERD (gastroesophageal reflux disease)   . Hypertension   . Peripheral vascular disease ([place])   . Shortness of breath dyspnea     Past Surgical History:  Procedure Laterality Date  . BACK SURGERY    . CARDIAC CATHETERIZATION    . CARDIAC CATHETERIZATION N/A 12/05/2015   Procedure: Left Heart Cath and Coronary Angiography;  Surgeon: Teodoro Spray, MD;  Location: [facility];  Service: Cardiovascular;  Laterality: N/A;  . LOWER EXTREMITY ANGIOGRAPHY Right 04/18/2019   Procedure: LOWER EXTREMITY ANGIOGRAPHY;  Surgeon: Algernon Huxley, MD;  Location: [facility];  Service: Cardiovascular;  Laterality: Right;  . LOWER EXTREMITY ANGIOGRAPHY Right 10/03/2019   Procedure: LOWER EXTREMITY ANGIOGRAPHY;  Surgeon: Annice Needy, MD;  Location: [facility];  Service: Cardiovascular;  Laterality: Right;  . PERIPHERAL VASCULAR BALLOON ANGIOPLASTY Right 04/18/2019   Procedure: PERIPHERAL VASCULAR BALLOON ANGIOPLASTY;  Surgeon: Annice Needy, MD;  Location: [facility];  Service: Cardiovascular;  Laterality: Right;  Right PT & Right AT  . PERIPHERAL VASCULAR INTERVENTION Right 04/18/2019   Procedure: PERIPHERAL VASCULAR INTERVENTION;  Surgeon: Annice Needy, MD;  Location: [facility];  Service:  Cardiovascular;  Laterality: Right;  Right SFA    Social History   Socioeconomic History  . Marital status: Married    Spouse name: Not on file  . Number of children: Not on file  . Years of education: Not on file  . Highest education level: Not on file  Occupational History  . Not on file  Tobacco Use  . Smoking status: Former Smoker    Years: 47.00    Quit date: 10/02/2019    Years since quitting: 0.0  . Smokeless tobacco: Never Used  Substance and Sexual Activity  . Alcohol use: Not Currently  . Drug use: Not on file  . Sexual activity: Not on file  Other Topics Concern  . Not on file  Social History Narrative  . Not on file   Social Determinants of Health   Financial Resource Strain:   . Difficulty of Paying Living Expenses: Not on file  Food Insecurity:   . Worried About Programme researcher, broadcasting/film/video in the Last Year: Not on file  . Ran Out of Food in the Last Year: Not on file  Transportation Needs:   . Lack of Transportation (Medical): Not on file  . Lack of Transportation (Non-Medical): Not on file  Physical Activity:   . Days of Exercise per Week: Not on file  . Minutes of Exercise per Session: Not on file  Stress:   . Feeling of Stress : Not on file  Social Connections:   . Frequency of Communication with Friends and Family: Not on file  . Frequency of Social Gatherings with Friends and Family: Not on file  . Attends Religious Services: Not on file  . Active Member of Clubs or Organizations: Not on file  . Attends Banker Meetings: Not on file  . Marital Status: Not on file  Intimate Partner Violence:   . Fear of Current or Ex-Partner: Not on file  . Emotionally Abused: Not on file  . Physically Abused: Not on file  . Sexually Abused: Not on file    Family History  Problem Relation Age of Onset  . Congenital heart disease Father   . Diabetes Brother     No Known Allergies   Review of Systems   Review of Systems: Negative Unless  Checked Constitutional: [] Weight loss  [] Fever  [] Chills Cardiac: [] Chest pain   []  Atrial Fibrillation  [] Palpitations   [] Shortness of breath when laying flat   [] Shortness of breath with exertion. [] Shortness of breath at rest Vascular:  [] Pain in legs with walking   [] Pain in legs with standing [] Pain in legs when laying flat   [x] Claudication    [] Pain in feet when laying flat    [] History of DVT   [] Phlebitis   [] Swelling in legs   [] Varicose veins   [] Non-healing ulcers Pulmonary:   [] Uses home oxygen   [] Productive cough   [] Hemoptysis   []   Wheeze  [] COPD   [] Asthma Neurologic:  [] Dizziness   [] Seizures  [] Blackouts [] History of stroke   [] History of TIA  [] Aphasia   [] Temporary Blindness   [] Weakness or numbness in arm   [x] Weakness or numbness in leg Musculoskeletal:   [] Joint swelling   [] Joint pain   [] Low back pain  []  History of Knee Replacement [] Arthritis [] back Surgeries  []  Spinal Stenosis    Hematologic:  [] Easy bruising  [] Easy bleeding   [] Hypercoagulable state   [] Anemic Gastrointestinal:  [] Diarrhea   [] Vomiting  [x] Gastroesophageal reflux/heartburn   [] Difficulty swallowing. [] Abdominal pain Genitourinary:  [] Chronic kidney disease   [] Difficult urination  [] Anuric   [] Blood in urine [] Frequent urination  [] Burning with urination   [] Hematuria Skin:  [] Rashes   [] Ulcers [] Wounds Psychological:  [] History of anxiety   []  History of major depression  []  Memory Difficulties      OBJECTIVE:   Physical Exam  BP (!) 149/79 (BP Location: Right Arm)   Pulse 77   Resp 12   Ht 5\' 9"  (1.753 m)   Wt 195 lb (88.5 kg)   BMI 28.80 kg/m   Gen: WD/WN, NAD Head: [place]/AT, No temporalis wasting.  Ear/Nose/Throat: Hearing grossly intact, nares w/o erythema or drainage Eyes: PER, EOMI, sclera nonicteric.  Neck: Supple, no masses.  No JVD.  Pulmonary:  Good air movement, no use of accessory muscles.  Cardiac: RRR Vascular:  Right great toe bluish discoloration.  Other toes have some  dusky discoloration however warmer than great toe. Vessel Right Left  Radial Palpable Palpable  Dorsalis Pedis Not palpable but able to doppler  Palpable  Posterior Tibial Not palpable but able to doppler Palpable   Gastrointestinal: soft, non-distended. No guarding/no peritoneal signs.  Musculoskeletal: M/S 5/5 throughout.  No deformity or atrophy.  Neurologic: Pain and light touch intact in extremities.  Symmetrical.  Speech is fluent. Motor exam as listed above. Psychiatric: Judgment intact, Mood & affect appropriate for pt's clinical situation. Dermatologic: No Venous rashes. No Ulcers Noted.  No changes consistent with cellulitis. Lymph : No Cervical lymphadenopathy, no lichenification or skin changes of chronic lymphedema.       ASSESSMENT AND PLAN:  1. Atherosclerosis of artery of extremity with rest pain (HCC) Based upon the patient's symptoms in addition to the noninvasive studies as well as physical exam, I feel that the issue is not necessarily occlusion of the artery but more so some distal embolization following his angioplasty.  There is a possibility that this could be embolization either in the toe directly or possibly in the arch which in which case would be difficult to reach with angioplasty.  Today, I have switched the patient from Plavix to Eliquis and hope to provide better perfusion as well as to help with some resolution of the embolization.  I have also advised the patient to hold on his Plavix as he may be transition back at some time.  The patient is advised to continue to take it twice a day until we see him back in the office in 2 to 3 weeks.  We will also do noninvasive studies to see if there is any improvement.  I discussed with the patient that if there is no improvement we may still discuss angiogram just to be sure that there is no area that we could possibly intervene to improve perfusion.  Patient is also advised to contact the office if his foot shows any  necrotic or gangrenous changes.  2. Hyperlipidemia associated with  type 2 diabetes mellitus (HCC) Good lipid control is important in slowing the progression of atherosclerotic disease.  Patient on appropriate medication.  No changes made today.  3. Essential (primary) hypertension Patient's blood pressure slightly elevated today however tolerable.  Patient on appropriate medication.  No changes made today.   Current Outpatient Medications on File Prior to Visit  Medication Sig Dispense Refill  . aspirin 81 MG tablet Take 81 mg by mouth daily.    . chlorthalidone (HYGROTON) 25 MG tablet Take 25 mg by mouth daily.    . clopidogrel (PLAVIX) 75 MG tablet Take 75 mg by mouth daily.    . isosorbide mononitrate (IMDUR) 60 MG 24 hr tablet Take 1 tablet (60 mg total) by mouth daily. 30 tablet 11  . metFORMIN (GLUCOPHAGE) 500 MG tablet Take 1,000 mg by mouth 2 (two) times daily with a meal.    . metoprolol succinate (TOPROL-XL) 25 MG 24 hr tablet Take 25 mg by mouth daily.    . nitroGLYCERIN (NITROSTAT) 0.4 MG SL tablet Place 0.4 mg under the tongue every 5 (five) minutes as needed for chest pain.    . pantoprazole (PROTONIX) 40 MG tablet Take 40 mg by mouth daily.    . rosuvastatin (CRESTOR) 5 MG tablet Take by mouth.    . naproxen sodium (ANAPROX) 220 MG tablet Take 220 mg by mouth daily as needed.     No current facility-administered medications on file prior to visit.    There are no Patient Instructions on file for this visit. No follow-ups on file.   Georgiana Spinner, NP  This note was completed with Office manager.  Any errors are purely unintentional.

## 2019-11-09 ENCOUNTER — Encounter (INDEPENDENT_AMBULATORY_CARE_PROVIDER_SITE_OTHER): Payer: Self-pay | Admitting: Nurse Practitioner

## 2019-11-09 ENCOUNTER — Other Ambulatory Visit (INDEPENDENT_AMBULATORY_CARE_PROVIDER_SITE_OTHER): Payer: Self-pay | Admitting: Nurse Practitioner

## 2019-11-09 ENCOUNTER — Other Ambulatory Visit: Payer: Self-pay

## 2019-11-09 ENCOUNTER — Ambulatory Visit (INDEPENDENT_AMBULATORY_CARE_PROVIDER_SITE_OTHER): Payer: Self-pay

## 2019-11-09 ENCOUNTER — Ambulatory Visit (INDEPENDENT_AMBULATORY_CARE_PROVIDER_SITE_OTHER): Payer: Self-pay | Admitting: Nurse Practitioner

## 2019-11-09 VITALS — BP 125/78 | HR 80 | Resp 16 | Wt 193.0 lb

## 2019-11-09 DIAGNOSIS — Z95820 Peripheral vascular angioplasty status with implants and grafts: Secondary | ICD-10-CM

## 2019-11-09 DIAGNOSIS — F17200 Nicotine dependence, unspecified, uncomplicated: Secondary | ICD-10-CM

## 2019-11-09 DIAGNOSIS — G629 Polyneuropathy, unspecified: Secondary | ICD-10-CM

## 2019-11-09 DIAGNOSIS — I70229 Atherosclerosis of native arteries of extremities with rest pain, unspecified extremity: Secondary | ICD-10-CM

## 2019-11-09 DIAGNOSIS — I70221 Atherosclerosis of native arteries of extremities with rest pain, right leg: Secondary | ICD-10-CM

## 2019-11-14 ENCOUNTER — Encounter (INDEPENDENT_AMBULATORY_CARE_PROVIDER_SITE_OTHER): Payer: Self-pay | Admitting: Nurse Practitioner

## 2019-11-14 NOTE — Progress Notes (Signed)
SUBJECTIVE:  Patient ID: Brad Clark, male    DOB: 1956-01-19, 64 y.o.   MRN: [id number] Chief Complaint  Patient presents with  . Follow-up    ultrasound follow up    HPI  Brad Clark is a 64 y.o. male that returns to the office for reevaluation of noninvasive studies.  The patient presented to the office 2 weeks ago several weeks after he underwent thrombectomy on 10/03/2019.  At that time, despite intervention the patient had flat toe waveforms but triphasic tibial artery waveforms.  The patient also had dopplerable pulses.  However the great toe was cold and blue with some dusky discoloration of the other digits.  Today, the right foot is warm with no blue discoloration.  The patient was started on Eliquis, as it was felt that this was likely a embolization following the thrombectomy.  The patient continues to have numbness and discomfort in his foot, especially near the toes but it is much less than it was prior to his revascularization.  Today the patient's noninvasive studies reveal an ABI 1.01 on the right lower extremity with a TBI of 0.56.  Previously on 10/24/2019 the ABI was 0.99 with a TBI of 0.  The left lower extremity has an ABI 0.77 with a TBI 0.58.  This is consistent with the studies on 10/24/2019.  The patient has triphasic tibial artery waveforms on the right with good toe waveforms today.  The left has biphasic tibial artery waveforms with dampened toe waveforms.  Past Medical History:  Diagnosis Date  . Anginal pain (HCC)   . Coronary artery disease   . GERD (gastroesophageal reflux disease)   . Hypertension   . Peripheral vascular disease (HCC)   . Shortness of breath dyspnea     Past Surgical History:  Procedure Laterality Date  . BACK SURGERY    . CARDIAC CATHETERIZATION    . CARDIAC CATHETERIZATION N/A 12/05/2015   Procedure: Left Heart Cath and Coronary Angiography;  Surgeon: Dalia Heading, MD;  Location: [facility];  Service:  Cardiovascular;  Laterality: N/A;  . LOWER EXTREMITY ANGIOGRAPHY Right 04/18/2019   Procedure: LOWER EXTREMITY ANGIOGRAPHY;  Surgeon: Annice Needy, MD;  Location: [facility];  Service: Cardiovascular;  Laterality: Right;  . LOWER EXTREMITY ANGIOGRAPHY Right 10/03/2019   Procedure: LOWER EXTREMITY ANGIOGRAPHY;  Surgeon: Annice Needy, MD;  Location: [facility];  Service: Cardiovascular;  Laterality: Right;  . PERIPHERAL VASCULAR BALLOON ANGIOPLASTY Right 04/18/2019   Procedure: PERIPHERAL VASCULAR BALLOON ANGIOPLASTY;  Surgeon: Annice Needy, MD;  Location: [facility];  Service: Cardiovascular;  Laterality: Right;  Right PT & Right AT  . PERIPHERAL VASCULAR INTERVENTION Right 04/18/2019   Procedure: PERIPHERAL VASCULAR INTERVENTION;  Surgeon: Annice Needy, MD;  Location: [facility];  Service: Cardiovascular;  Laterality: Right;  Right SFA    Social History   Socioeconomic History  . Marital status: Married    Spouse name: Not on file  . Number of children: Not on file  . Years of education: Not on file  . Highest education level: Not on file  Occupational History  . Not on file  Tobacco Use  . Smoking status: Former Smoker    Years: 47.00    Quit date: 10/02/2019    Years since quitting: 0.1  . Smokeless tobacco: Never Used  Substance and Sexual Activity  . Alcohol use: Not Currently  . Drug use: Not on file  .  Sexual activity: Not on file  Other Topics Concern  . Not on file  Social History Narrative  . Not on file   Social Determinants of Health   Financial Resource Strain:   . Difficulty of Paying Living Expenses: Not on file  Food Insecurity:   . Worried About Charity fundraiser in the Last Year: Not on file  . Ran Out of Food in the Last Year: Not on file  Transportation Needs:   . Lack of Transportation (Medical): Not on file  . Lack of Transportation (Non-Medical): Not on file  Physical Activity:   . Days of Exercise per Week: Not on  file  . Minutes of Exercise per Session: Not on file  Stress:   . Feeling of Stress : Not on file  Social Connections:   . Frequency of Communication with Friends and Family: Not on file  . Frequency of Social Gatherings with Friends and Family: Not on file  . Attends Religious Services: Not on file  . Active Member of Clubs or Organizations: Not on file  . Attends Archivist Meetings: Not on file  . Marital Status: Not on file  Intimate Partner Violence:   . Fear of Current or Ex-Partner: Not on file  . Emotionally Abused: Not on file  . Physically Abused: Not on file  . Sexually Abused: Not on file    Family History  Problem Relation Age of Onset  . Congenital heart disease Father   . Diabetes Brother     No Known Allergies   Review of Systems   Review of Systems: Negative Unless Checked Constitutional: [] Weight loss  [] Fever  [] Chills Cardiac: [] Chest pain   []  Atrial Fibrillation  [] Palpitations   [] Shortness of breath when laying flat   [] Shortness of breath with exertion. [] Shortness of breath at rest Vascular:  [] Pain in legs with walking   [] Pain in legs with standing [] Pain in legs when laying flat   [x] Claudication    [] Pain in feet when laying flat    [] History of DVT   [] Phlebitis   [] Swelling in legs   [] Varicose veins   [] Non-healing ulcers Pulmonary:   [] Uses home oxygen   [] Productive cough   [] Hemoptysis   [] Wheeze  [] COPD   [] Asthma Neurologic:  [] Dizziness   [] Seizures  [] Blackouts [] History of stroke   [] History of TIA  [] Aphasia   [] Temporary Blindness   [] Weakness or numbness in arm   [x] Weakness or numbness in leg Musculoskeletal:   [] Joint swelling   [] Joint pain   [] Low back pain  []  History of Knee Replacement [] Arthritis [] back Surgeries  []  Spinal Stenosis    Hematologic:  [] Easy bruising  [] Easy bleeding   [] Hypercoagulable state   [] Anemic Gastrointestinal:  [] Diarrhea   [] Vomiting  [x] Gastroesophageal reflux/heartburn   [] Difficulty  swallowing. [] Abdominal pain Genitourinary:  [] Chronic kidney disease   [] Difficult urination  [] Anuric   [] Blood in urine [] Frequent urination  [] Burning with urination   [] Hematuria Skin:  [] Rashes   [] Ulcers [] Wounds Psychological:  [] History of anxiety   []  History of major depression  []  Memory Difficulties      OBJECTIVE:   Physical Exam  BP 125/78 (BP Location: Right Arm)   Pulse 80   Resp 16   Wt 193 lb (87.5 kg)   BMI 28.50 kg/m   Gen: WD/WN, NAD Head: [place]/AT, No temporalis wasting.  Ear/Nose/Throat: Hearing grossly intact, nares w/o erythema or drainage Eyes: PER, EOMI, sclera nonicteric.  Neck: Supple,  no masses.  No JVD.  Pulmonary:  Good air movement, no use of accessory muscles.  Cardiac: RRR Vascular:  Right foot warm with no discoloration Vessel Right Left  Radial Palpable Palpable  Dorsalis Pedis Not Palpable Palpable  Posterior Tibial Not Palpable Palpable   Gastrointestinal: soft, non-distended. No guarding/no peritoneal signs.  Musculoskeletal: M/S 5/5 throughout.  No deformity or atrophy.  Neurologic: Pain and light touch intact in extremities.  Symmetrical.  Speech is fluent. Motor exam as listed above. Psychiatric: Judgment intact, Mood & affect appropriate for pt's clinical situation. Dermatologic: No Venous rashes. No Ulcers Noted.  No changes consistent with cellulitis. Lymph : No Cervical lymphadenopathy, no lichenification or skin changes of chronic lymphedema.       ASSESSMENT AND PLAN:  1. Atherosclerosis of artery of extremity with rest pain (HCC) Patient's foot looks markedly better than previous visit 2 weeks ago.  His great toe is no longer blue and his toe is also warm.  The patient continues to have some numbness in his great toe and foot however this is more likely a result of having extensive ischemia prior to revascularization.  Discussed with patient that this may subside on its own or it may persist.  Discussed Neurontin with the  patient however the patient does not wish to proceed at this time.  Advised the patient to continue to take Eliquis until he is out at which point he can transition back to Plavix.  We will have the patient return to the office for noninvasive studies in 3 months.  2. Tobacco use disorder Patient continues to abstain.  3. Neuropathy As mentioned above, the neuropathy is likely due to extensive persistent ischemia that the patient had prior to revascularization.  Discussed possible treatment options however at this time the patient wishes to continue with conservative options.   Current Outpatient Medications on File Prior to Visit  Medication Sig Dispense Refill  . apixaban (ELIQUIS) 5 MG TABS tablet Take 1 tablet (5 mg total) by mouth 2 (two) times daily. 60 tablet 0  . aspirin 81 MG tablet Take 81 mg by mouth daily.    . chlorthalidone (HYGROTON) 25 MG tablet Take 25 mg by mouth daily.    . isosorbide mononitrate (IMDUR) 60 MG 24 hr tablet Take 1 tablet (60 mg total) by mouth daily. 30 tablet 11  . metFORMIN (GLUCOPHAGE) 500 MG tablet Take 1,000 mg by mouth 2 (two) times daily with a meal.    . metoprolol succinate (TOPROL-XL) 25 MG 24 hr tablet Take 25 mg by mouth daily.    . naproxen sodium (ANAPROX) 220 MG tablet Take 220 mg by mouth daily as needed.    . nitroGLYCERIN (NITROSTAT) 0.4 MG SL tablet Place 0.4 mg under the tongue every 5 (five) minutes as needed for chest pain.    . pantoprazole (PROTONIX) 40 MG tablet Take 40 mg by mouth daily.    . rosuvastatin (CRESTOR) 5 MG tablet Take by mouth.    . clopidogrel (PLAVIX) 75 MG tablet Take 75 mg by mouth daily.     No current facility-administered medications on file prior to visit.    There are no Patient Instructions on file for this visit. No follow-ups on file.   Georgiana Spinner, NP  This note was completed with Office manager.  Any errors are purely unintentional.

## 2020-01-11 ENCOUNTER — Other Ambulatory Visit (INDEPENDENT_AMBULATORY_CARE_PROVIDER_SITE_OTHER): Payer: Self-pay | Admitting: Nurse Practitioner

## 2020-01-11 DIAGNOSIS — I70229 Atherosclerosis of native arteries of extremities with rest pain, unspecified extremity: Secondary | ICD-10-CM

## 2020-01-24 ENCOUNTER — Ambulatory Visit (INDEPENDENT_AMBULATORY_CARE_PROVIDER_SITE_OTHER): Payer: Self-pay | Admitting: Nurse Practitioner

## 2020-01-24 ENCOUNTER — Ambulatory Visit (INDEPENDENT_AMBULATORY_CARE_PROVIDER_SITE_OTHER): Payer: Self-pay

## 2020-01-24 ENCOUNTER — Encounter (INDEPENDENT_AMBULATORY_CARE_PROVIDER_SITE_OTHER): Payer: Self-pay | Admitting: Nurse Practitioner

## 2020-01-24 ENCOUNTER — Other Ambulatory Visit: Payer: Self-pay

## 2020-01-24 VITALS — BP 148/81 | HR 67 | Resp 16 | Wt 195.4 lb

## 2020-01-24 DIAGNOSIS — I70229 Atherosclerosis of native arteries of extremities with rest pain, unspecified extremity: Secondary | ICD-10-CM

## 2020-01-24 DIAGNOSIS — I70213 Atherosclerosis of native arteries of extremities with intermittent claudication, bilateral legs: Secondary | ICD-10-CM

## 2020-01-24 DIAGNOSIS — E785 Hyperlipidemia, unspecified: Secondary | ICD-10-CM

## 2020-01-24 DIAGNOSIS — I10 Essential (primary) hypertension: Secondary | ICD-10-CM

## 2020-01-25 ENCOUNTER — Telehealth (INDEPENDENT_AMBULATORY_CARE_PROVIDER_SITE_OTHER): Payer: Self-pay

## 2020-01-25 ENCOUNTER — Encounter (INDEPENDENT_AMBULATORY_CARE_PROVIDER_SITE_OTHER): Payer: Self-pay | Admitting: Nurse Practitioner

## 2020-01-25 NOTE — Progress Notes (Signed)
Subjective:    Patient ID: Brad Clark, male    DOB: 23-Apr-1956, 64 y.o.   MRN: [id number] Chief Complaint  Patient presents with  . Follow-up    ultrasound follow up    Brad Clark is a 64 y.o. male that returns to the office for reevaluation of noninvasive studies.  Since the patient's last visit he denies rest pain or severe claudication. The patient continues to have numbness in his right toes however he is noted that his left great toe has become numb recently as well.  The patient has transitioned from Eliquis to Plavix and has not noticed any discoloration of his digits returning.  He denies any fever, chills, nausea, vomiting or diarrhea.  He denies any TIA or strokelike symptoms or amaurosis fugax.  Today the patient has ABIs of 1.06 on the right and 0.81 on the left.  The patient underwent a bilateral lower extremity arterial duplex.  The right lower extremity has biphasic waveforms all the way throughout with patent stents.  The left lower extremity has monophasic waveforms within the common femoral as well as deep femoral arteries with biphasic waveforms within the proximal to mid SFA at the distal SFA the patient has a 75 to 99% stenosis with monophasic waveforms down through his tibials.      Review of Systems  Neurological: Positive for numbness.  All other systems reviewed and are negative.      Objective:   Physical Exam Vitals reviewed.  HENT:     Head: Normocephalic.  Cardiovascular:     Rate and Rhythm: Normal rate and regular rhythm.     Heart sounds: Normal heart sounds.  Pulmonary:     Effort: Pulmonary effort is normal.     Breath sounds: Normal breath sounds.  Neurological:     Mental Status: He is alert and oriented to person, place, and time.  Psychiatric:        Mood and Affect: Mood normal.        Behavior: Behavior normal.        Thought Content: Thought content normal.        Judgment: Judgment normal.     BP (!) 148/81 (BP  Location: Right Arm)   Pulse 67   Resp 16   Wt 195 lb 6.4 oz (88.6 kg)   BMI 28.86 kg/m   Past Medical History:  Diagnosis Date  . Anginal pain (HCC)   . Coronary artery disease   . GERD (gastroesophageal reflux disease)   . Hypertension   . Peripheral vascular disease (HCC)   . Shortness of breath dyspnea     Social History   Socioeconomic History  . Marital status: Married    Spouse name: Not on file  . Number of children: Not on file  . Years of education: Not on file  . Highest education level: Not on file  Occupational History  . Not on file  Tobacco Use  . Smoking status: Former Smoker    Years: 47.00    Quit date: 10/02/2019    Years since quitting: 0.3  . Smokeless tobacco: Never Used  Substance and Sexual Activity  . Alcohol use: Not Currently  . Drug use: Not on file  . Sexual activity: Not on file  Other Topics Concern  . Not on file  Social History Narrative  . Not on file   Social Determinants of Health   Financial Resource Strain:   . Difficulty of Paying Living Expenses:  Food Insecurity:   . Worried About Programme researcher, broadcasting/film/video in the Last Year:   . Barista in the Last Year:   Transportation Needs:   . Freight forwarder (Medical):   Marland Kitchen Lack of Transportation (Non-Medical):   Physical Activity:   . Days of Exercise per Week:   . Minutes of Exercise per Session:   Stress:   . Feeling of Stress :   Social Connections:   . Frequency of Communication with Friends and Family:   . Frequency of Social Gatherings with Friends and Family:   . Attends Religious Services:   . Active Member of Clubs or Organizations:   . Attends Banker Meetings:   Marland Kitchen Marital Status:   Intimate Partner Violence:   . Fear of Current or Ex-Partner:   . Emotionally Abused:   Marland Kitchen Physically Abused:   . Sexually Abused:     Past Surgical History:  Procedure Laterality Date  . BACK SURGERY    . CARDIAC CATHETERIZATION    . CARDIAC  CATHETERIZATION N/A 12/05/2015   Procedure: Left Heart Cath and Coronary Angiography;  Surgeon: Dalia Heading, MD;  Location: [facility];  Service: Cardiovascular;  Laterality: N/A;  . LOWER EXTREMITY ANGIOGRAPHY Right 04/18/2019   Procedure: LOWER EXTREMITY ANGIOGRAPHY;  Surgeon: Annice Needy, MD;  Location: [facility];  Service: Cardiovascular;  Laterality: Right;  . LOWER EXTREMITY ANGIOGRAPHY Right 10/03/2019   Procedure: LOWER EXTREMITY ANGIOGRAPHY;  Surgeon: Annice Needy, MD;  Location: [facility];  Service: Cardiovascular;  Laterality: Right;  . PERIPHERAL VASCULAR BALLOON ANGIOPLASTY Right 04/18/2019   Procedure: PERIPHERAL VASCULAR BALLOON ANGIOPLASTY;  Surgeon: Annice Needy, MD;  Location: [facility];  Service: Cardiovascular;  Laterality: Right;  Right PT & Right AT  . PERIPHERAL VASCULAR INTERVENTION Right 04/18/2019   Procedure: PERIPHERAL VASCULAR INTERVENTION;  Surgeon: Annice Needy, MD;  Location: [facility];  Service: Cardiovascular;  Laterality: Right;  Right SFA    Family History  Problem Relation Age of Onset  . Congenital heart disease Father   . Diabetes Brother     No Known Allergies     Assessment & Plan:   1. Atherosclerosis of native artery of both lower extremities with intermittent claudication (HCC) Recommend:  The patient has experienced increased symptoms and is now describing lifestyle limiting claudication and mild rest pain.  Given the severity of the patient's lower extremity symptoms the patient should undergo angiography and intervention.  Risk and benefits were reviewed the patient.  Indications for the procedure were reviewed.  All questions were answered, the patient agrees to proceed.   The patient should continue walking and begin a more formal exercise program.  The patient should continue antiplatelet therapy and aggressive treatment of the lipid abnormalities  The patient will follow up with me  after the angiogram.   2. Essential (primary) hypertension Continue antihypertensive medications as already ordered, these medications have been reviewed and there are no changes at this time.   3. Hyperlipidemia, unspecified hyperlipidemia type Continue statin as ordered and reviewed, no changes at this time    Current Outpatient Medications on File Prior to Visit  Medication Sig Dispense Refill  . aspirin 81 MG tablet Take 81 mg by mouth daily.    . chlorthalidone (HYGROTON) 25 MG tablet Take 25 mg by mouth daily.    . clopidogrel (PLAVIX) 75 MG tablet Take 75 mg by mouth daily.    Marland Kitchen  isosorbide mononitrate (IMDUR) 60 MG 24 hr tablet Take 1 tablet (60 mg total) by mouth daily. 30 tablet 11  . metFORMIN (GLUCOPHAGE) 500 MG tablet Take 1,000 mg by mouth 2 (two) times daily with a meal.    . metoprolol succinate (TOPROL-XL) 25 MG 24 hr tablet Take 25 mg by mouth daily.    . naproxen sodium (ANAPROX) 220 MG tablet Take 220 mg by mouth daily as needed.    . nitroGLYCERIN (NITROSTAT) 0.4 MG SL tablet Place 0.4 mg under the tongue every 5 (five) minutes as needed for chest pain.    . pantoprazole (PROTONIX) 40 MG tablet Take 40 mg by mouth daily.    . rosuvastatin (CRESTOR) 5 MG tablet Take by mouth.    Marland Kitchen apixaban (ELIQUIS) 5 MG TABS tablet Take 1 tablet (5 mg total) by mouth 2 (two) times daily. (Patient not taking: Reported on 01/24/2020) 60 tablet 0   No current facility-administered medications on file prior to visit.    There are no Patient Instructions on file for this visit. No follow-ups on file.   Kris Hartmann, NP

## 2020-01-25 NOTE — Telephone Encounter (Signed)
Spoke with the patient's spouse and he is now scheduled with Dr. Wyn Quaker for a LLE angio on 02/01/20 with a 9:30 am arrival time to the [facility]. Patient will do covid testing on 01/30/20 between 8-1 pm at the [facility]. Pre-procedure instructions were discussed and will be me mailed to patient.

## 2020-01-30 ENCOUNTER — Other Ambulatory Visit
Admission: RE | Admit: 2020-01-30 | Discharge: 2020-01-30 | Disposition: A | Discharge status: Home / Self Care | Payer: HRSA Program | Source: Ambulatory Visit | Attending: Vascular Surgery | Admitting: Vascular Surgery

## 2020-01-30 ENCOUNTER — Other Ambulatory Visit: Payer: Self-pay

## 2020-01-30 DIAGNOSIS — Z01812 Encounter for preprocedural laboratory examination: Secondary | ICD-10-CM | POA: Insufficient documentation

## 2020-01-30 DIAGNOSIS — Z20822 Contact with and (suspected) exposure to covid-19: Secondary | ICD-10-CM | POA: Insufficient documentation

## 2020-01-30 LAB — SARS CORONAVIRUS 2 (TAT 6-24 HRS): SARS Coronavirus 2: NEGATIVE

## 2020-01-31 ENCOUNTER — Other Ambulatory Visit (INDEPENDENT_AMBULATORY_CARE_PROVIDER_SITE_OTHER): Payer: Self-pay | Admitting: Nurse Practitioner

## 2020-02-01 ENCOUNTER — Encounter: Payer: Self-pay | Admitting: Vascular Surgery

## 2020-02-01 ENCOUNTER — Other Ambulatory Visit: Payer: Self-pay

## 2020-02-01 ENCOUNTER — Encounter
Admission: RE | Disposition: A | Discharge status: Home / Self Care | Payer: Self-pay | Source: Home / Self Care | Attending: Vascular Surgery

## 2020-02-01 ENCOUNTER — Ambulatory Visit
Admission: RE | Admit: 2020-02-01 | Discharge: 2020-02-01 | Disposition: A | Discharge status: Home / Self Care | Payer: Self-pay | Attending: Vascular Surgery | Admitting: Vascular Surgery

## 2020-02-01 DIAGNOSIS — I25119 Atherosclerotic heart disease of native coronary artery with unspecified angina pectoris: Secondary | ICD-10-CM | POA: Insufficient documentation

## 2020-02-01 DIAGNOSIS — Z79899 Other long term (current) drug therapy: Secondary | ICD-10-CM | POA: Insufficient documentation

## 2020-02-01 DIAGNOSIS — Z87891 Personal history of nicotine dependence: Secondary | ICD-10-CM | POA: Insufficient documentation

## 2020-02-01 DIAGNOSIS — I70212 Atherosclerosis of native arteries of extremities with intermittent claudication, left leg: Secondary | ICD-10-CM | POA: Insufficient documentation

## 2020-02-01 DIAGNOSIS — I10 Essential (primary) hypertension: Secondary | ICD-10-CM | POA: Insufficient documentation

## 2020-02-01 DIAGNOSIS — Z7901 Long term (current) use of anticoagulants: Secondary | ICD-10-CM | POA: Insufficient documentation

## 2020-02-01 DIAGNOSIS — Z7902 Long term (current) use of antithrombotics/antiplatelets: Secondary | ICD-10-CM | POA: Insufficient documentation

## 2020-02-01 DIAGNOSIS — K219 Gastro-esophageal reflux disease without esophagitis: Secondary | ICD-10-CM | POA: Insufficient documentation

## 2020-02-01 DIAGNOSIS — R0602 Shortness of breath: Secondary | ICD-10-CM | POA: Insufficient documentation

## 2020-02-01 DIAGNOSIS — Z7982 Long term (current) use of aspirin: Secondary | ICD-10-CM | POA: Insufficient documentation

## 2020-02-01 DIAGNOSIS — Z7984 Long term (current) use of oral hypoglycemic drugs: Secondary | ICD-10-CM | POA: Insufficient documentation

## 2020-02-01 DIAGNOSIS — I70219 Atherosclerosis of native arteries of extremities with intermittent claudication, unspecified extremity: Secondary | ICD-10-CM

## 2020-02-01 DIAGNOSIS — E785 Hyperlipidemia, unspecified: Secondary | ICD-10-CM | POA: Insufficient documentation

## 2020-02-01 HISTORY — PX: LOWER EXTREMITY ANGIOGRAPHY: CATH118251

## 2020-02-01 LAB — GLUCOSE, CAPILLARY
Glucose-Capillary: 103 mg/dL — ABNORMAL HIGH (ref 70–99)
Glucose-Capillary: 127 mg/dL — ABNORMAL HIGH (ref 70–99)

## 2020-02-01 LAB — CREATININE, SERUM
Creatinine, Ser: 0.95 mg/dL (ref 0.61–1.24)
GFR calc Af Amer: >60 mL/min (ref >60)
GFR calc non Af Amer: >60 mL/min (ref >60)

## 2020-02-01 LAB — BUN: BUN: 11 mg/dL (ref 8–23)

## 2020-02-01 SURGERY — LOWER EXTREMITY ANGIOGRAPHY
Anesthesia: Moderate Sedation | Site: Leg Lower | Laterality: Left

## 2020-02-01 MED ORDER — SODIUM CHLORIDE 0.9 % IV SOLN: INTRAVENOUS | Status: DC

## 2020-02-01 MED ORDER — HEPARIN (PORCINE) IN NACL 2000-0.9 UNIT/L-% IV SOLN: INTRAVENOUS | Status: DC | PRN

## 2020-02-01 MED ORDER — HYDROMORPHONE HCL 1 MG/ML IJ SOLN: 1.0000 mg | Freq: Once | INTRAMUSCULAR | Status: DC | PRN

## 2020-02-01 MED ORDER — IODIXANOL 320 MG/ML IV SOLN
INTRAVENOUS | Status: DC | PRN
  Administered 2020-02-01: 60 mL

## 2020-02-01 MED ORDER — FAMOTIDINE 20 MG PO TABS: 40.0000 mg | ORAL_TABLET | Freq: Once | ORAL | Status: DC | PRN

## 2020-02-01 MED ORDER — IPRATROPIUM-ALBUTEROL 0.5-2.5 (3) MG/3ML IN SOLN
RESPIRATORY_TRACT | Status: AC
  Filled 2020-02-01: qty 3

## 2020-02-01 MED ORDER — MIDAZOLAM HCL 5 MG/5ML IJ SOLN
INTRAMUSCULAR | Status: AC
  Filled 2020-02-01: qty 5

## 2020-02-01 MED ORDER — CEFAZOLIN SODIUM-DEXTROSE 2-4 GM/100ML-% IV SOLN
2.0000 g | Freq: Once | INTRAVENOUS | Status: AC
  Administered 2020-02-01: 2 g via INTRAVENOUS

## 2020-02-01 MED ORDER — FENTANYL CITRATE (PF) 100 MCG/2ML IJ SOLN
INTRAMUSCULAR | Status: DC | PRN
  Administered 2020-02-01: 25 ug via INTRAVENOUS
  Administered 2020-02-01: 50 ug via INTRAVENOUS

## 2020-02-01 MED ORDER — METHYLPREDNISOLONE SODIUM SUCC 125 MG IJ SOLR: 125.0000 mg | Freq: Once | INTRAMUSCULAR | Status: DC | PRN

## 2020-02-01 MED ORDER — ONDANSETRON HCL 4 MG/2ML IJ SOLN: 4.0000 mg | Freq: Four times a day (QID) | INTRAMUSCULAR | Status: DC | PRN

## 2020-02-01 MED ORDER — HEPARIN SODIUM (PORCINE) 1000 UNIT/ML IJ SOLN
INTRAMUSCULAR | Status: DC | PRN
  Administered 2020-02-01: 5000 [IU] via INTRAVENOUS

## 2020-02-01 MED ORDER — IPRATROPIUM-ALBUTEROL 0.5-2.5 (3) MG/3ML IN SOLN
3.0000 mL | Freq: Once | RESPIRATORY_TRACT | Status: AC
  Administered 2020-02-01: 3 mL via RESPIRATORY_TRACT

## 2020-02-01 MED ORDER — FENTANYL CITRATE (PF) 100 MCG/2ML IJ SOLN
INTRAMUSCULAR | Status: AC
  Filled 2020-02-01: qty 2

## 2020-02-01 MED ORDER — MIDAZOLAM HCL 2 MG/2ML IJ SOLN
INTRAMUSCULAR | Status: DC | PRN
  Administered 2020-02-01: 2 mg via INTRAVENOUS
  Administered 2020-02-01: 1 mg via INTRAVENOUS

## 2020-02-01 MED ORDER — MIDAZOLAM HCL 2 MG/ML PO SYRP: 8.0000 mg | ORAL_SOLUTION | Freq: Once | ORAL | Status: DC | PRN

## 2020-02-01 MED ORDER — DIPHENHYDRAMINE HCL 50 MG/ML IJ SOLN: 50.0000 mg | Freq: Once | INTRAMUSCULAR | Status: DC | PRN

## 2020-02-01 MED ORDER — HEPARIN SODIUM (PORCINE) 1000 UNIT/ML IJ SOLN
INTRAMUSCULAR | Status: AC
  Filled 2020-02-01: qty 1

## 2020-02-01 MED ORDER — CEFAZOLIN SODIUM-DEXTROSE 2-4 GM/100ML-% IV SOLN
INTRAVENOUS | Status: AC
  Filled 2020-02-01: qty 100

## 2020-02-01 SURGICAL SUPPLY — 21 items
BALLN LUTONIX 018 5X300X130 (BALLOONS) ×3
BALLN ULTRASCORE 7X40X130 (BALLOONS) ×3
BALLN ULTRVRSE 3X150X150 (BALLOONS) ×3
BALLOON LUTONIX 018 5X300X130 (BALLOONS) IMPLANT
BALLOON ULTRASCORE 7X40X130 (BALLOONS) IMPLANT
BALLOON ULTRVRSE 3X150X150 (BALLOONS) IMPLANT
CATH ANGIO 5F PIGTAIL 65CM (CATHETERS) ×2 IMPLANT
CATH BEACON 5 .035 65 RIM TIP (CATHETERS) ×2 IMPLANT
CATH CXI SUPP ANG 4FR 135 (CATHETERS) IMPLANT
CATH CXI SUPP ANG 4FR 135CM (CATHETERS) ×3
DEVICE PRESTO INFLATION (MISCELLANEOUS) ×2 IMPLANT
DEVICE STARCLOSE SE CLOSURE (Vascular Products) ×2 IMPLANT
GLIDEWIRE ADV .035X260CM (WIRE) ×2 IMPLANT
PACK ANGIOGRAPHY (CUSTOM PROCEDURE TRAY) ×2 IMPLANT
SHEATH ANL2 6FRX45 HC (SHEATH) ×2 IMPLANT
SHEATH BRITE TIP 5FRX11 (SHEATH) ×2 IMPLANT
STENT VIABAHN 6X150X120 (Permanent Stent) ×2 IMPLANT
SYR MEDRAD MARK 7 150ML (SYRINGE) ×2 IMPLANT
TUBING CONTRAST HIGH PRESS 72 (TUBING) ×2 IMPLANT
WIRE G V18X300CM (WIRE) ×2 IMPLANT
WIRE J 3MM .035X145CM (WIRE) ×2 IMPLANT

## 2020-02-01 NOTE — Op Note (Signed)
[facility]  Percutaneous Study/Intervention Procedural Note   Date of Surgery: 02/01/2020  Surgeon(s):Ellis Koffler    Assistants:none  Pre-operative Diagnosis: PAD with claudication left lower extremity  Post-operative diagnosis:  Same  Procedure(s) Performed:             1.  Ultrasound guidance for vascular access right femoral artery             2.  Catheter placement into left common femoral artery from right femoral approach             3.  Aortogram and selective left lower extremity angiogram including a selective image of the left anterior tibial artery             4.  Percutaneous transluminal angioplasty of left anterior tibial artery with 3 mm diameter by 15 cm length angioplasty balloon             5.   Percutaneous transluminal angioplasty of left SFA with 5 mm diameter by 30 cm length Lutonix drug-coated angioplasty balloon  6.  Viabahn stent placement to the left SFA with 6 mm diameter by 15 cm length stent for greater than 50% residual stenosis after angioplasty  7.  Percutaneous transluminal angioplasty of the left common iliac artery with 7 mm diameter by 4 cm length ultra score balloon             8.  StarClose closure device right femoral artery  EBL: 10 cc  Contrast: 60 cc  Fluoro Time: 5.3 minutes  Moderate Conscious Sedation Time: approximately 40 minutes using 3 mg of Versed and 75 mcg of Fentanyl              Indications:  Patient is a 63 y.o.male with claudication symptoms of the left leg.  He has previously undergone treatment of the right leg. The patient has noninvasive study showing normal right ABI with a left ABI of 0.8 and SFA stenosis on duplex. The patient is brought in for angiography for further evaluation and potential treatment. Risks and benefits are discussed and informed consent is obtained.   Procedure:  The patient was identified and appropriate procedural time out was performed.  The patient was then placed supine on  the table and prepped and draped in the usual sterile fashion. Moderate conscious sedation was administered during a face to face encounter with the patient throughout the procedure with my supervision of the RN administering medicines and monitoring the patient's vital signs, pulse oximetry, telemetry and mental status throughout from the start of the procedure until the patient was taken to the recovery room. Ultrasound was used to evaluate the right common femoral artery.  It was patent .  A digital ultrasound image was acquired.  A Seldinger needle was used to access the right common femoral artery under direct ultrasound guidance and a permanent image was performed.  A 0.035 J wire was advanced without resistance and a 5Fr sheath was placed.  Pigtail catheter was placed into the aorta and an AP aortogram was performed. This demonstrated that the left renal artery was not well seen.  No significant right renal artery stenosis.  The aorta was patent.  The left common iliac artery had about a 60 to 65% stenosis at its origin with no significant disease below this.  The right iliac system had mild disease that did not appear flow-limiting. I then crossed the aortic bifurcation and advanced to the left femoral head. Selective left lower extremity  angiogram was then performed. This demonstrated diffusely diseased SFA with multiple areas of greater than 50% stenosis and about an 80 to 90% stenosis in the mid to distal segment which was the worst spot.  This was over about a 20 to 25 cm span.  The popliteal artery then normalized and there was a typical tibial trifurcation.  The tibial vessels were not well seen on initial imaging but the posterior tibial artery appeared to be the dominant runoff to the foot with a small peroneal artery.  The anterior tibial artery appeared to have significant stenosis in the proximal segment but some flow was seen distally on the initial imaging. It was felt that it was in the  patient's best interest to proceed with intervention after these images to avoid a second procedure and a larger amount of contrast and fluoroscopy based off of the findings from the initial angiogram. The patient was systemically heparinized and a 6 [place] Ansell sheath was then placed over the [employer] wire. I then used a CXI catheter and the advantage wire to navigate through the SFA lesions and down into the popliteal artery.  I then advanced down to the proximal portion of the anterior tibial artery where selective imaging confirmed a greater than 80% stenosis in the proximal anterior tibial artery with normalization of the anterior tibial artery in the proximal to mid segment and this to the foot.  And exchanged for a 0.018 wire and parked this into the foot and the anterior tibial artery.  A 3 mm diameter by 15 cm length angioplasty balloon was inflated to 10 atm for 1 minute in the proximal anterior tibial artery.  A 5 mm diameter by 30 cm length Lutonix drug-coated angioplasty balloon was inflated in the superficial femoral artery up to 8 atm for 1 minute.  The anterior tibial artery had less than 20% residual stenosis.  The superficial femoral artery had 2 areas of greater than 50% residual stenosis in the mid and mid to distal segment.  I elected to treat this with stent placement.  A 6 mm diameter by 15 cm length Viabahn stent was then deployed in the mid and mid to distal SFA and postdilated with 5 mm balloon with excellent angiographic completion result and less than 10% residual stenosis.  The proximal left common iliac lesion which was moderate was then addressed.  This was an area where stent placement require kissing stents and rebuilding of the distal aorta, and I did not want to access the left femoral artery today after heparin and treatment.  I elected to balloon this with an ultra score balloon to try to avoid stent placement and a 7 mm diameter by 4 cm length ultra score balloon was  inflated up to 10 atm for 1 minute.  Completion imaging showed improvement although there was still probably 30 to 35% residual stenosis in the proximal left common iliac artery.  I elected to leave this mild residual stenosis.  I elected to terminate the procedure. The sheath was removed and StarClose closure device was deployed in the right femoral artery with excellent hemostatic result. The patient was taken to the recovery room in stable condition having tolerated the procedure well.  Findings:               Aortogram:  The left renal artery was not well seen.  No significant right renal artery stenosis.  The aorta was patent.  The left common iliac artery had about a 60 to  65% stenosis at its origin with no significant disease below this.  The right iliac system had mild disease that did not appear flow-limiting.             Left lower Extremity:  This demonstrated diffusely diseased SFA with multiple areas of greater than 50% stenosis and about an 80 to 90% stenosis in the mid to distal segment which was the worst spot.  This was over about a 20 to 25 cm span.  The popliteal artery then normalized and there was a typical tibial trifurcation.  The tibial vessels were not well seen on initial imaging but the posterior tibial artery appeared to be the dominant runoff to the foot with a small peroneal artery.  The anterior tibial artery appeared to have significant stenosis in the proximal segment but some flow was seen distally on the initial imaging   Disposition: Patient was taken to the recovery room in stable condition having tolerated the procedure well.  Complications: None  Leotis Pain 02/01/2020 12:05 PM   This note was created with Dragon Medical transcription system. Any errors in dictation are purely unintentional.

## 2020-02-01 NOTE — Discharge Instructions (Signed)
Femoral Site Care This sheet gives you information about how to care for yourself after your procedure. Your health care provider may also give you more specific instructions. If you have problems or questions, contact your health care provider. What can I expect after the procedure? After the procedure, it is common to have:  Bruising that usually fades within 1-2 weeks.  Tenderness at the site. Follow these instructions at home: Wound care  Follow instructions from your health care provider about how to take care of your insertion site. Make sure you: ? Wash your hands with soap and water before you change your bandage (dressing). If soap and water are not available, use hand sanitizer. ? Change your dressing as told by your health care provider. ? Leave stitches (sutures), skin glue, or adhesive strips in place. These skin closures may need to stay in place for 2 weeks or longer. If adhesive strip edges start to loosen and curl up, you may trim the loose edges. Do not remove adhesive strips completely unless your health care provider tells you to do that.  Do not take baths, swim, or use a hot tub until your health care provider approves.  You may shower 24-48 hours after the procedure or as told by your health care provider. ? Gently wash the site with plain soap and water. ? Pat the area dry with a clean towel. ? Do not rub the site. This may cause bleeding.  Do not apply powder or lotion to the site. Keep the site clean and dry.  Check your femoral site every day for signs of infection. Check for: ? Redness, swelling, or pain. ? Fluid or blood. ? Warmth. ? Pus or a bad smell. Activity  For the first 2-3 days after your procedure, or as long as directed: ? Avoid climbing stairs as much as possible. ? Do not squat.  Do not lift anything that is heavier than 10 lb (4.5 kg), or the limit that you are told, until your health care provider says that it is safe.  Rest as  directed. ? Avoid sitting for a long time without moving. Get up to take short walks every 1-2 hours.  Do not drive for 24 hours if you were given a medicine to help you relax (sedative). General instructions  Take over-the-counter and prescription medicines only as told by your health care provider.  Keep all follow-up visits as told by your health care provider. This is important. Contact a health care provider if you have:  A fever or chills.  You have redness, swelling, or pain around your insertion site. Get help right away if:  The catheter insertion area swells very fast.  You pass out.  You suddenly start to sweat or your skin gets clammy.  The catheter insertion area is bleeding, and the bleeding does not stop when you hold steady pressure on the area.  The area near or just beyond the catheter insertion site becomes pale, cool, tingly, or numb. These symptoms may represent a serious problem that is an emergency. Do not wait to see if the symptoms will go away. Get medical help right away. Call your local emergency services (911 in the [place] Do not drive yourself to the hospital. Summary  After the procedure, it is common to have bruising that usually fades within 1-2 weeks.  Check your femoral site every day for signs of infection.  Do not lift anything that is heavier than 10 lb (4.5 kg), or the   limit that you are told, until your health care provider says that it is safe. This information is not intended to replace advice given to you by your health care provider. Make sure you discuss any questions you have with your health care provider. Document Revised: 09/14/2017 Document Reviewed: 09/14/2017 Elsevier Patient Education  2020 [employer] Angiogram, Care After This sheet gives you information about how to care for yourself after your procedure. Your doctor may also give you more specific instructions. If you have problems or questions, contact your  doctor. Follow these instructions at home: Insertion site care  Follow instructions from your doctor about how to take care of your long, thin tube (catheter) insertion area. Make sure you: ? Wash your hands with soap and water before you change your bandage (dressing). If you cannot use soap and water, use hand sanitizer. ? Change your bandage as told by your doctor. ? Leave stitches (sutures), skin glue, or skin tape (adhesive) strips in place. They may need to stay in place for 2 weeks or longer. If tape strips get loose and curl up, you may trim the loose edges. Do not remove tape strips completely unless your doctor says it is okay.  Do not take baths, swim, or use a hot tub until your doctor says it is okay.  You may shower 24-48 hours after the procedure or as told by your doctor. ? Gently wash the area with plain soap and water. ? Pat the area dry with a clean towel. ? Do not rub the area. This may cause bleeding.  Do not apply powder or lotion to the area. Keep the area clean and dry.  Check your insertion area every day for signs of infection. Check for: ? More redness, swelling, or pain. ? Fluid or blood. ? Warmth. ? Pus or a bad smell. Activity  Rest as told by your doctor, usually for 1-2 days.  Do not lift anything that is heavier than 10 lbs. (4.5 kg) or as told by your doctor.  Do not drive for 24 hours if you were given a medicine to help you relax (sedative).  Do not drive or use heavy machinery while taking prescription pain medicine. General instructions   Go back to your normal activities as told by your doctor, usually in about a week. Ask your doctor what activities are safe for you.  If the insertion area starts to bleed, lie flat and put pressure on the area. If the bleeding does not stop, get help right away. This is an emergency.  Drink enough fluid to keep your pee (urine) clear or pale yellow.  Take over-the-counter and prescription medicines only  as told by your doctor.  Keep all follow-up visits as told by your doctor. This is important. Contact a doctor if:  You have a fever.  You have chills.  You have more redness, swelling, or pain around your insertion area.  You have fluid or blood coming from your insertion area.  The insertion area feels warm to the touch.  You have pus or a bad smell coming from your insertion area.  You have more bruising around the insertion area.  Blood collects in the tissue around the insertion area (hematoma) that may be painful to the touch. Get help right away if:  You have a lot of pain in the insertion area.  The insertion area swells very fast.  The insertion area is bleeding, and the bleeding does not stop after holding steady   pressure on the area.  The area near or just beyond the insertion area becomes pale, cool, tingly, or numb. These symptoms may be an emergency. Do not wait to see if the symptoms will go away. Get medical help right away. Call your local emergency services (911 in the [place] Do not drive yourself to the hospital. Summary  After the procedure, it is common to have bruising and tenderness at the long, thin tube insertion area.  After the procedure, it is important to rest and drink plenty of fluids.  Do not take baths, swim, or use a hot tub until your doctor says it is okay to do so. You may shower 24-48 hours after the procedure or as told by your doctor.  If the insertion area starts to bleed, lie flat and put pressure on the area. If the bleeding does not stop, get help right away. This is an emergency. This information is not intended to replace advice given to you by your health care provider. Make sure you discuss any questions you have with your health care provider. Document Revised: 08/14/2017 Document Reviewed: 08/26/2016 Elsevier Patient Education  2020 [employer] Inc. Moderate Conscious Sedation, Adult, Care After These instructions provide you  with information about caring for yourself after your procedure. Your health care provider may also give you more specific instructions. Your treatment has been planned according to current medical practices, but problems sometimes occur. Call your health care provider if you have any problems or questions after your procedure. What can I expect after the procedure? After your procedure, it is common:  To feel sleepy for several hours.  To feel clumsy and have poor balance for several hours.  To have poor judgment for several hours.  To vomit if you eat too soon. Follow these instructions at home: For at least 24 hours after the procedure:   Do not: ? Participate in activities where you could fall or become injured. ? Drive. ? Use heavy machinery. ? Drink alcohol. ? Take sleeping pills or medicines that cause drowsiness. ? Make important decisions or sign legal documents. ? Take care of children on your own.  Rest. Eating and drinking  Follow the diet recommended by your health care provider.  If you vomit: ? Drink water, juice, or soup when you can drink without vomiting. ? Make sure you have little or no nausea before eating solid foods. General instructions  Have a responsible adult stay with you until you are awake and alert.  Take over-the-counter and prescription medicines only as told by your health care provider.  If you smoke, do not smoke without supervision.  Keep all follow-up visits as told by your health care provider. This is important. Contact a health care provider if:  You keep feeling nauseous or you keep vomiting.  You feel light-headed.  You develop a rash.  You have a fever. Get help right away if:  You have trouble breathing. This information is not intended to replace advice given to you by your health care provider. Make sure you discuss any questions you have with your health care provider. Document Revised: 08/14/2017 Document Reviewed:  12/22/2015 Elsevier Patient Education  2020 Elsevier Inc.  

## 2020-02-01 NOTE — H&P (Signed)
[facility] History & Physical Update  The patient was interviewed and re-examined.  The patient's previous History and Physical has been reviewed and is unchanged.  There is no change in the plan of care. We plan to proceed with the scheduled procedure.  Festus Barren, MD  02/01/2020, 9:27 AM

## 2020-02-02 ENCOUNTER — Telehealth (INDEPENDENT_AMBULATORY_CARE_PROVIDER_SITE_OTHER): Payer: Self-pay | Admitting: Vascular Surgery

## 2020-02-02 NOTE — Telephone Encounter (Signed)
He can take it off.  He should just put a regular band aid over the site and watch to ensure it doesn't bleed

## 2020-02-02 NOTE — Telephone Encounter (Signed)
I informed the pt of the Np's instructions . The pt made me aware that he might wait until tomorrow to take off the bandage to ensure the wound does not  bleed.

## 2020-02-02 NOTE — Telephone Encounter (Signed)
The pt had a lower extremity angiography left  yesterday and would like to know if he can take off his bandage its saturated in blood or should he leave it on. He is scared of the blood drying and sticking to the wound. He is not having any pain .

## 2020-02-06 ENCOUNTER — Telehealth (INDEPENDENT_AMBULATORY_CARE_PROVIDER_SITE_OTHER): Payer: Self-pay

## 2020-02-06 NOTE — Telephone Encounter (Signed)
Patient called complaining of pain in his left knee and wanting to know if it will go away. Patient had a left leg angio on 02/01/20 with Dr. Wyn Quaker.

## 2020-02-06 NOTE — Telephone Encounter (Signed)
Spoke with the patient and gave him the recommendation from Sheppard Plumber NP. Patient stated he had his leg elevated with ice as well. I did let him know if it gets worse to give our office a call. See notes below.

## 2020-02-06 NOTE — Telephone Encounter (Signed)
Typically pain after angio is related to some stretching of the artery.  The pain generally will go away with in a week or so.  Also because the pain is in the knee, it could be unrelated to the angio and just be knee pain.  He can try some tylenol or ibuprofen to see how that helps his discomfort

## 2020-03-06 ENCOUNTER — Other Ambulatory Visit (INDEPENDENT_AMBULATORY_CARE_PROVIDER_SITE_OTHER): Payer: Self-pay | Admitting: Vascular Surgery

## 2020-03-06 DIAGNOSIS — Z95820 Peripheral vascular angioplasty status with implants and grafts: Secondary | ICD-10-CM

## 2020-03-06 DIAGNOSIS — I70212 Atherosclerosis of native arteries of extremities with intermittent claudication, left leg: Secondary | ICD-10-CM

## 2020-03-07 ENCOUNTER — Other Ambulatory Visit: Payer: Self-pay

## 2020-03-07 ENCOUNTER — Encounter (INDEPENDENT_AMBULATORY_CARE_PROVIDER_SITE_OTHER): Payer: Self-pay | Admitting: Nurse Practitioner

## 2020-03-07 ENCOUNTER — Ambulatory Visit (INDEPENDENT_AMBULATORY_CARE_PROVIDER_SITE_OTHER): Payer: Self-pay

## 2020-03-07 ENCOUNTER — Ambulatory Visit (INDEPENDENT_AMBULATORY_CARE_PROVIDER_SITE_OTHER): Payer: Self-pay | Admitting: Nurse Practitioner

## 2020-03-07 VITALS — BP 162/87 | HR 86 | Resp 16 | Wt 192.8 lb

## 2020-03-07 DIAGNOSIS — Z95820 Peripheral vascular angioplasty status with implants and grafts: Secondary | ICD-10-CM

## 2020-03-07 DIAGNOSIS — E1169 Type 2 diabetes mellitus with other specified complication: Secondary | ICD-10-CM

## 2020-03-07 DIAGNOSIS — I70229 Atherosclerosis of native arteries of extremities with rest pain, unspecified extremity: Secondary | ICD-10-CM

## 2020-03-07 DIAGNOSIS — I10 Essential (primary) hypertension: Secondary | ICD-10-CM

## 2020-03-07 DIAGNOSIS — F17200 Nicotine dependence, unspecified, uncomplicated: Secondary | ICD-10-CM

## 2020-03-07 DIAGNOSIS — E785 Hyperlipidemia, unspecified: Secondary | ICD-10-CM

## 2020-03-07 DIAGNOSIS — I70212 Atherosclerosis of native arteries of extremities with intermittent claudication, left leg: Secondary | ICD-10-CM

## 2020-03-07 DIAGNOSIS — E119 Type 2 diabetes mellitus without complications: Secondary | ICD-10-CM

## 2020-03-12 ENCOUNTER — Encounter (INDEPENDENT_AMBULATORY_CARE_PROVIDER_SITE_OTHER): Payer: Self-pay | Admitting: Nurse Practitioner

## 2020-03-12 NOTE — Progress Notes (Signed)
Subjective:    Patient ID: Brad Clark, male    DOB: 1956-02-17, 64 y.o.   MRN: [id number] Chief Complaint  Patient presents with  . Follow-up    [facility] 5wk abi    The patient returns to the office for followup and review status post angiogram with intervention. The patient notes improvement in the lower extremity symptoms. No interval shortening of the patient's claudication distance or rest pain symptoms. No new ulcers or wounds have occurred since the last visit.  There have been no significant changes to the patient's overall health care.  The patient denies amaurosis fugax or recent TIA symptoms. There are no recent neurological changes noted. The patient denies history of DVT, PE or superficial thrombophlebitis. The patient denies recent episodes of angina or shortness of breath.   ABI's Rt=1.06 and Lt=0.81  (previous ABI's Rt=1.01 and Lt=0.77) Duplex US of the right lower extremity reveals biphasic waveforms with monophasic waveforms in the left tibial arteries   Review of Systems  Neurological: Positive for numbness.  All other systems reviewed and are negative.      Objective:   Physical Exam Vitals reviewed.  HENT:     Head: Normocephalic.  Cardiovascular:     Rate and Rhythm: Normal rate and regular rhythm.     Pulses: Decreased pulses.  Pulmonary:     Effort: Pulmonary effort is normal.     Breath sounds: Normal breath sounds.  Neurological:     Mental Status: He is alert and oriented to person, place, and time.  Psychiatric:        Mood and Affect: Mood normal.        Behavior: Behavior normal.        Thought Content: Thought content normal.        Judgment: Judgment normal.     BP (!) 162/87 (BP Location: Right Arm)   Pulse 86   Resp 16   Wt 192 lb 12.8 oz (87.5 kg)   BMI 28.47 kg/m   Past Medical History:  Diagnosis Date  . Anginal pain (HCC)   . Coronary artery disease   . GERD (gastroesophageal reflux disease)   . Hypertension   .  Peripheral vascular disease (HCC)   . Shortness of breath dyspnea     Social History   Socioeconomic History  . Marital status: Married    Spouse name: Not on file  . Number of children: Not on file  . Years of education: Not on file  . Highest education level: Not on file  Occupational History  . Not on file  Tobacco Use  . Smoking status: Current Every Day Smoker    Packs/day: 0.50    Years: 47.00    Pack years: 23.50    Last attempt to quit: 10/02/2019    Years since quitting: 0.4  . Smokeless tobacco: Never Used  Substance and Sexual Activity  . Alcohol use: Not Currently  . Drug use: Not on file  . Sexual activity: Not on file  Other Topics Concern  . Not on file  Social History Narrative  . Not on file   Social Determinants of Health   Financial Resource Strain:   . Difficulty of Paying Living Expenses:   Food Insecurity:   . Worried About Programme researcher, broadcasting/film/video in the Last Year:   . Barista in the Last Year:   Transportation Needs:   . Freight forwarder (Medical):   Marland Kitchen Lack of Transportation (Non-Medical):  Physical Activity:   . Days of Exercise per Week:   . Minutes of Exercise per Session:   Stress:   . Feeling of Stress :   Social Connections:   . Frequency of Communication with Friends and Family:   . Frequency of Social Gatherings with Friends and Family:   . Attends Religious Services:   . Active Member of Clubs or Organizations:   . Attends Banker Meetings:   Marland Kitchen Marital Status:   Intimate Partner Violence:   . Fear of Current or Ex-Partner:   . Emotionally Abused:   Marland Kitchen Physically Abused:   . Sexually Abused:     Past Surgical History:  Procedure Laterality Date  . BACK SURGERY    . CARDIAC CATHETERIZATION    . CARDIAC CATHETERIZATION N/A 12/05/2015   Procedure: Left Heart Cath and Coronary Angiography;  Surgeon: Dalia Heading, MD;  Location: [facility];  Service: Cardiovascular;  Laterality: N/A;  . LOWER  EXTREMITY ANGIOGRAPHY Right 04/18/2019   Procedure: LOWER EXTREMITY ANGIOGRAPHY;  Surgeon: Annice Needy, MD;  Location: [facility];  Service: Cardiovascular;  Laterality: Right;  . LOWER EXTREMITY ANGIOGRAPHY Right 10/03/2019   Procedure: LOWER EXTREMITY ANGIOGRAPHY;  Surgeon: Annice Needy, MD;  Location: [facility];  Service: Cardiovascular;  Laterality: Right;  . LOWER EXTREMITY ANGIOGRAPHY Left 02/01/2020   Procedure: LOWER EXTREMITY ANGIOGRAPHY;  Surgeon: Annice Needy, MD;  Location: [facility];  Service: Cardiovascular;  Laterality: Left;  . PERIPHERAL VASCULAR BALLOON ANGIOPLASTY Right 04/18/2019   Procedure: PERIPHERAL VASCULAR BALLOON ANGIOPLASTY;  Surgeon: Annice Needy, MD;  Location: [facility];  Service: Cardiovascular;  Laterality: Right;  Right PT & Right AT  . PERIPHERAL VASCULAR INTERVENTION Right 04/18/2019   Procedure: PERIPHERAL VASCULAR INTERVENTION;  Surgeon: Annice Needy, MD;  Location: [facility];  Service: Cardiovascular;  Laterality: Right;  Right SFA    Family History  Problem Relation Age of Onset  . Congenital heart disease Father   . Diabetes Brother     No Known Allergies     Assessment & Plan:   1. Atherosclerosis of artery of extremity with rest pain (HCC) Recommend:  The patient is status post successful angiogram with intervention.  The patient reports that the claudication symptoms and leg pain is essentially gone.   The patient denies lifestyle limiting changes at this point in time.  No further invasive studies, angiography or surgery at this time The patient should continue walking and begin a more formal exercise program.  The patient should continue antiplatelet therapy and aggressive treatment of the lipid abnormalities  Smoking cessation was again discussed  The patient should continue wearing graduated compression socks 10-15 mmHg strength to control the mild edema.  Patient should undergo  noninvasive studies as ordered. The patient will follow up with me after the studies.    2. Tobacco use disorder Smoking cessation was discussed, 3-10 minutes spent on this topic specifically   3. Hyperlipidemia associated with type 2 diabetes mellitus (HCC) Continue statin as ordered and reviewed, no changes at this time   4. Essential (primary) hypertension Continue antihypertensive medications as already ordered, these medications have been reviewed and there are no changes at this time.   5. Type 2 diabetes mellitus without complication, without long-term current use of insulin (HCC) Continue hypoglycemic medications as already ordered, these medications have been reviewed and there are no changes at this time.  Hgb A1C  to be monitored as already arranged by primary service    Current Outpatient Medications on File Prior to Visit  Medication Sig Dispense Refill  . aspirin 81 MG tablet Take 81 mg by mouth daily.    . chlorthalidone (HYGROTON) 25 MG tablet Take 25 mg by mouth daily.    . clopidogrel (PLAVIX) 75 MG tablet Take 75 mg by mouth daily.    . isosorbide mononitrate (IMDUR) 60 MG 24 hr tablet Take 1 tablet (60 mg total) by mouth daily. 30 tablet 11  . metFORMIN (GLUCOPHAGE) 500 MG tablet Take 1,000 mg by mouth 2 (two) times daily with a meal.    . metoprolol succinate (TOPROL-XL) 25 MG 24 hr tablet Take 25 mg by mouth daily.    . naproxen sodium (ANAPROX) 220 MG tablet Take 220 mg by mouth daily as needed.    . nitroGLYCERIN (NITROSTAT) 0.4 MG SL tablet Place 0.4 mg under the tongue every 5 (five) minutes as needed for chest pain.    . pantoprazole (PROTONIX) 40 MG tablet Take 40 mg by mouth daily.    . rosuvastatin (CRESTOR) 5 MG tablet Take by mouth.     No current facility-administered medications on file prior to visit.    There are no Patient Instructions on file for this visit. No follow-ups on file.   Kris Hartmann, NP

## 2020-05-24 ENCOUNTER — Other Ambulatory Visit (INDEPENDENT_AMBULATORY_CARE_PROVIDER_SITE_OTHER): Payer: Self-pay | Admitting: Nurse Practitioner

## 2020-05-24 DIAGNOSIS — I739 Peripheral vascular disease, unspecified: Secondary | ICD-10-CM

## 2020-05-25 ENCOUNTER — Ambulatory Visit (INDEPENDENT_AMBULATORY_CARE_PROVIDER_SITE_OTHER): Payer: 59

## 2020-05-25 ENCOUNTER — Other Ambulatory Visit: Payer: Self-pay

## 2020-05-25 ENCOUNTER — Encounter (INDEPENDENT_AMBULATORY_CARE_PROVIDER_SITE_OTHER): Payer: Self-pay | Admitting: Nurse Practitioner

## 2020-05-25 ENCOUNTER — Ambulatory Visit (INDEPENDENT_AMBULATORY_CARE_PROVIDER_SITE_OTHER): Payer: 59 | Admitting: Nurse Practitioner

## 2020-05-25 VITALS — BP 125/75 | HR 80 | Resp 16 | Wt 193.0 lb

## 2020-05-25 DIAGNOSIS — F17200 Nicotine dependence, unspecified, uncomplicated: Secondary | ICD-10-CM | POA: Diagnosis not present

## 2020-05-25 DIAGNOSIS — I739 Peripheral vascular disease, unspecified: Secondary | ICD-10-CM | POA: Diagnosis not present

## 2020-05-25 DIAGNOSIS — E785 Hyperlipidemia, unspecified: Secondary | ICD-10-CM

## 2020-05-25 DIAGNOSIS — I10 Essential (primary) hypertension: Secondary | ICD-10-CM

## 2020-05-25 DIAGNOSIS — I70213 Atherosclerosis of native arteries of extremities with intermittent claudication, bilateral legs: Secondary | ICD-10-CM | POA: Diagnosis not present

## 2020-05-30 ENCOUNTER — Encounter (INDEPENDENT_AMBULATORY_CARE_PROVIDER_SITE_OTHER): Payer: Self-pay | Admitting: Nurse Practitioner

## 2020-05-30 NOTE — Progress Notes (Signed)
Subjective:    Patient ID: Brad Clark, male    DOB: [birth year], 64 y.o.   MRN: [id number] Chief Complaint  Patient presents with  . Follow-up    ultrasound follow up    The patient returns to the office for followup and review of the noninvasive studies. There have been no interval changes in lower extremity symptoms. No interval shortening of the patient's claudication distance or development of rest pain symptoms. No new ulcers or wounds have occurred since the last visit.  There have been no significant changes to the patient's overall health care.  The patient denies amaurosis fugax or recent TIA symptoms. There are no recent neurological changes noted. The patient denies history of DVT, PE or superficial thrombophlebitis. The patient denies recent episodes of angina or shortness of breath.   ABI Rt=1.19 and Lt=1.14  (previous ABI's Rt=1.09 and Lt=1.06) Duplex ultrasound of the bilateral lower extremity reveal triphasic tibial artery waveforms with good toe waveforms   Review of Systems  Cardiovascular: Negative for leg swelling.  Skin: Negative for wound.  All other systems reviewed and are negative.      Objective:   Physical Exam Vitals reviewed.  HENT:     Head: Normocephalic.  Cardiovascular:     Rate and Rhythm: Normal rate and regular rhythm.     Pulses: Normal pulses.  Pulmonary:     Effort: Pulmonary effort is normal.  Musculoskeletal:        General: Normal range of motion.  Skin:    General: Skin is warm and dry.  Neurological:     Mental Status: He is alert and oriented to person, place, and time.  Psychiatric:        Mood and Affect: Mood normal.        Behavior: Behavior normal.        Thought Content: Thought content normal.        Judgment: Judgment normal.     BP 125/75 (BP Location: Right Arm)   Pulse 80   Resp 16   Wt 193 lb (87.5 kg)   BMI 28.50 kg/m   Past Medical History:  Diagnosis Date  . Anginal pain (HCC)   .  Coronary artery disease   . GERD (gastroesophageal reflux disease)   . Hypertension   . Peripheral vascular disease (HCC)   . Shortness of breath dyspnea     Social History   Socioeconomic History  . Marital status: Married    Spouse name: Not on file  . Number of children: Not on file  . Years of education: Not on file  . Highest education level: Not on file  Occupational History  . Not on file  Tobacco Use  . Smoking status: Current Every Day Smoker    Packs/day: 0.50    Years: 47.00    Pack years: 23.50    Last attempt to quit: 10/02/2019    Years since quitting: 0.6  . Smokeless tobacco: Never Used  Substance and Sexual Activity  . Alcohol use: Not Currently  . Drug use: Not on file  . Sexual activity: Not on file  Other Topics Concern  . Not on file  Social History Narrative  . Not on file   Social Determinants of Health   Financial Resource Strain:   . Difficulty of Paying Living Expenses: Not on file  Food Insecurity:   . Worried About Programme researcher, broadcasting/film/video in the Last Year: Not on file  . Ran Out of  Food in the Last Year: Not on file  Transportation Needs:   . Lack of Transportation (Medical): Not on file  . Lack of Transportation (Non-Medical): Not on file  Physical Activity:   . Days of Exercise per Week: Not on file  . Minutes of Exercise per Session: Not on file  Stress:   . Feeling of Stress : Not on file  Social Connections:   . Frequency of Communication with Friends and Family: Not on file  . Frequency of Social Gatherings with Friends and Family: Not on file  . Attends Religious Services: Not on file  . Active Member of Clubs or Organizations: Not on file  . Attends Banker Meetings: Not on file  . Marital Status: Not on file  Intimate Partner Violence:   . Fear of Current or Ex-Partner: Not on file  . Emotionally Abused: Not on file  . Physically Abused: Not on file  . Sexually Abused: Not on file    Past Surgical History:    Procedure Laterality Date  . BACK SURGERY    . CARDIAC CATHETERIZATION    . CARDIAC CATHETERIZATION N/A 12/05/2015   Procedure: Left Heart Cath and Coronary Angiography;  Surgeon: Dalia Heading, MD;  Location: [facility];  Service: Cardiovascular;  Laterality: N/A;  . LOWER EXTREMITY ANGIOGRAPHY Right 04/18/2019   Procedure: LOWER EXTREMITY ANGIOGRAPHY;  Surgeon: Annice Needy, MD;  Location: [facility];  Service: Cardiovascular;  Laterality: Right;  . LOWER EXTREMITY ANGIOGRAPHY Right 10/03/2019   Procedure: LOWER EXTREMITY ANGIOGRAPHY;  Surgeon: Annice Needy, MD;  Location: [facility];  Service: Cardiovascular;  Laterality: Right;  . LOWER EXTREMITY ANGIOGRAPHY Left 02/01/2020   Procedure: LOWER EXTREMITY ANGIOGRAPHY;  Surgeon: Annice Needy, MD;  Location: [facility];  Service: Cardiovascular;  Laterality: Left;  . PERIPHERAL VASCULAR BALLOON ANGIOPLASTY Right 04/18/2019   Procedure: PERIPHERAL VASCULAR BALLOON ANGIOPLASTY;  Surgeon: Annice Needy, MD;  Location: [facility];  Service: Cardiovascular;  Laterality: Right;  Right PT & Right AT  . PERIPHERAL VASCULAR INTERVENTION Right 04/18/2019   Procedure: PERIPHERAL VASCULAR INTERVENTION;  Surgeon: Annice Needy, MD;  Location: [facility];  Service: Cardiovascular;  Laterality: Right;  Right SFA    Family History  Problem Relation Age of Onset  . Congenital heart disease Father   . Diabetes Brother     No Known Allergies     Assessment & Plan:   1. Hyperlipidemia, unspecified hyperlipidemia type Continue statin as ordered and reviewed, no changes at this time   2. Tobacco use disorder Smoking cessation was discussed, 3-10 minutes spent on this topic specifically   3. Atherosclerosis of native artery of both lower extremities with intermittent claudication (HCC)  Recommend:  The patient has evidence of atherosclerosis of the lower extremities with claudication.  The patient  does not voice lifestyle limiting changes at this point in time.  Noninvasive studies do not suggest clinically significant change.  No invasive studies, angiography or surgery at this time The patient should continue walking and begin a more formal exercise program.  The patient should continue antiplatelet therapy and aggressive treatment of the lipid abnormalities  No changes in the patient's medications at this time  The patient should continue wearing graduated compression socks 10-15 mmHg strength to control the mild edema.    4. Essential (primary) hypertension Continue antihypertensive medications as already ordered, these medications have been reviewed and there are no  changes at this time.    Current Outpatient Medications on File Prior to Visit  Medication Sig Dispense Refill  . aspirin 81 MG tablet Take 81 mg by mouth daily.    . chlorthalidone (HYGROTON) 25 MG tablet Take 25 mg by mouth daily.    . clopidogrel (PLAVIX) 75 MG tablet Take 75 mg by mouth daily.    . isosorbide mononitrate (IMDUR) 60 MG 24 hr tablet Take 1 tablet (60 mg total) by mouth daily. 30 tablet 11  . metFORMIN (GLUCOPHAGE) 500 MG tablet Take 1,000 mg by mouth 2 (two) times daily with a meal.    . metoprolol succinate (TOPROL-XL) 25 MG 24 hr tablet Take 25 mg by mouth daily.    . naproxen sodium (ANAPROX) 220 MG tablet Take 220 mg by mouth daily as needed.    . nitroGLYCERIN (NITROSTAT) 0.4 MG SL tablet Place 0.4 mg under the tongue every 5 (five) minutes as needed for chest pain.    . pantoprazole (PROTONIX) 40 MG tablet Take 40 mg by mouth daily.    . rosuvastatin (CRESTOR) 5 MG tablet Take by mouth.     No current facility-administered medications on file prior to visit.    There are no Patient Instructions on file for this visit. No follow-ups on file.   Georgiana Spinner, NP

## 2020-11-15 ENCOUNTER — Other Ambulatory Visit (INDEPENDENT_AMBULATORY_CARE_PROVIDER_SITE_OTHER): Payer: Self-pay | Admitting: Nurse Practitioner

## 2020-11-15 DIAGNOSIS — I739 Peripheral vascular disease, unspecified: Secondary | ICD-10-CM

## 2020-11-23 ENCOUNTER — Other Ambulatory Visit: Payer: Self-pay

## 2020-11-23 ENCOUNTER — Ambulatory Visit (INDEPENDENT_AMBULATORY_CARE_PROVIDER_SITE_OTHER): Payer: 59

## 2020-11-23 ENCOUNTER — Encounter (INDEPENDENT_AMBULATORY_CARE_PROVIDER_SITE_OTHER): Payer: Self-pay | Admitting: Nurse Practitioner

## 2020-11-23 ENCOUNTER — Ambulatory Visit (INDEPENDENT_AMBULATORY_CARE_PROVIDER_SITE_OTHER): Payer: 59 | Admitting: Nurse Practitioner

## 2020-11-23 VITALS — BP 158/89 | HR 70 | Ht 69.0 in | Wt 203.0 lb

## 2020-11-23 DIAGNOSIS — I10 Essential (primary) hypertension: Secondary | ICD-10-CM | POA: Diagnosis not present

## 2020-11-23 DIAGNOSIS — I70229 Atherosclerosis of native arteries of extremities with rest pain, unspecified extremity: Secondary | ICD-10-CM | POA: Diagnosis not present

## 2020-11-23 DIAGNOSIS — I739 Peripheral vascular disease, unspecified: Secondary | ICD-10-CM | POA: Diagnosis not present

## 2020-11-23 DIAGNOSIS — E785 Hyperlipidemia, unspecified: Secondary | ICD-10-CM | POA: Diagnosis not present

## 2020-11-23 DIAGNOSIS — F17200 Nicotine dependence, unspecified, uncomplicated: Secondary | ICD-10-CM | POA: Diagnosis not present

## 2020-11-23 NOTE — Progress Notes (Signed)
Subjective:    Patient ID: Brad Clark, male    DOB: 01-24-1956, 65 y.o.   MRN: [id number] Chief Complaint  Patient presents with  . Follow-up    6 Mo U/S     The patient returns to the office for followup and review of the noninvasive studies. There have been no interval changes in lower extremity symptoms. No interval shortening of the patient's claudication distance or development of rest pain symptoms. No new ulcers or wounds have occurred since the last visit.  He has continued to abstain from smoking  There have been no significant changes to the patient's overall health care.  The patient denies amaurosis fugax or recent TIA symptoms. There are no recent neurological changes noted. The patient denies history of DVT, PE or superficial thrombophlebitis. The patient denies recent episodes of angina or shortness of breath.   ABI Rt=1.04 and Lt=1.05  (previous ABI's Rt=1.09 and Lt=1.06) Duplex ultrasound of the patient has triphasic tibial artery waveforms bilaterally and good toe waveforms bilaterally   Review of Systems  Cardiovascular: Negative for leg swelling.  Skin: Negative for wound.  All other systems reviewed and are negative.      Objective:   Physical Exam Vitals reviewed.  HENT:     Head: Normocephalic.  Cardiovascular:     Rate and Rhythm: Normal rate.     Pulses: Normal pulses.  Pulmonary:     Effort: Pulmonary effort is normal.  Neurological:     Mental Status: He is alert and oriented to person, place, and time.  Psychiatric:        Mood and Affect: Mood normal.        Behavior: Behavior normal.        Thought Content: Thought content normal.        Judgment: Judgment normal.     BP (!) 158/89   Pulse 70   Ht 5\' 9"  (1.753 m)   Wt 203 lb (92.1 kg)   BMI 29.98 kg/m   Past Medical History:  Diagnosis Date  . Anginal pain (HCC)   . Coronary artery disease   . GERD (gastroesophageal reflux disease)   . Hypertension   . Peripheral  vascular disease (HCC)   . Shortness of breath dyspnea     Social History   Socioeconomic History  . Marital status: Married    Spouse name: Not on file  . Number of children: Not on file  . Years of education: Not on file  . Highest education level: Not on file  Occupational History  . Not on file  Tobacco Use  . Smoking status: Former Smoker    Packs/day: 0.50    Years: 47.00    Pack years: 23.50    Quit date: 10/02/2019    Years since quitting: 1.1  . Smokeless tobacco: Never Used  Substance and Sexual Activity  . Alcohol use: Not Currently  . Drug use: Not on file  . Sexual activity: Not on file  Other Topics Concern  . Not on file  Social History Narrative  . Not on file   Social Determinants of Health   Financial Resource Strain: Not on file  Food Insecurity: Not on file  Transportation Needs: Not on file  Physical Activity: Not on file  Stress: Not on file  Social Connections: Not on file  Intimate Partner Violence: Not on file    Past Surgical History:  Procedure Laterality Date  . BACK SURGERY    . CARDIAC  CATHETERIZATION    . CARDIAC CATHETERIZATION N/A 12/05/2015   Procedure: Left Heart Cath and Coronary Angiography;  Surgeon: Dalia Heading, MD;  Location: [facility];  Service: Cardiovascular;  Laterality: N/A;  . LOWER EXTREMITY ANGIOGRAPHY Right 04/18/2019   Procedure: LOWER EXTREMITY ANGIOGRAPHY;  Surgeon: Annice Needy, MD;  Location: [facility];  Service: Cardiovascular;  Laterality: Right;  . LOWER EXTREMITY ANGIOGRAPHY Right 10/03/2019   Procedure: LOWER EXTREMITY ANGIOGRAPHY;  Surgeon: Annice Needy, MD;  Location: [facility];  Service: Cardiovascular;  Laterality: Right;  . LOWER EXTREMITY ANGIOGRAPHY Left 02/01/2020   Procedure: LOWER EXTREMITY ANGIOGRAPHY;  Surgeon: Annice Needy, MD;  Location: [facility];  Service: Cardiovascular;  Laterality: Left;  . PERIPHERAL VASCULAR BALLOON ANGIOPLASTY Right 04/18/2019    Procedure: PERIPHERAL VASCULAR BALLOON ANGIOPLASTY;  Surgeon: Annice Needy, MD;  Location: [facility];  Service: Cardiovascular;  Laterality: Right;  Right PT & Right AT  . PERIPHERAL VASCULAR INTERVENTION Right 04/18/2019   Procedure: PERIPHERAL VASCULAR INTERVENTION;  Surgeon: Annice Needy, MD;  Location: [facility];  Service: Cardiovascular;  Laterality: Right;  Right SFA    Family History  Problem Relation Age of Onset  . Congenital heart disease Father   . Diabetes Brother     No Known Allergies  CBC Latest Ref Rng & Units 04/12/2014  WBC 3.8 - 10.6 x10 3/mm 3 5.5  Hemoglobin 13.0 - 18.0 g/dL 64.4  Hematocrit 03.4 - 52.0 % 47.6  Platelets 150 - 440 x10 3/mm 3 166      CMP     Component Value Date/Time   NA 135 (L) 04/12/2014 0843   K 4.1 04/12/2014 0843   CL 103 04/12/2014 0843   CO2 26 04/12/2014 0843   GLUCOSE 143 (H) 04/12/2014 0843   BUN 11 02/01/2020 0944   BUN 15 04/12/2014 0843   CREATININE 0.95 02/01/2020 0944   CREATININE 1.01 04/12/2014 0843   CALCIUM 8.0 (L) 04/12/2014 0843   GFRNONAA >60 02/01/2020 0944   GFRNONAA >60 04/12/2014 0843   GFRAA >60 02/01/2020 0944   GFRAA >60 04/12/2014 0843     VAS [place] ABI WITH/WO TBI  Result Date: 11/23/2020 LOWER EXTREMITY DOPPLER STUDY Indications: Peripheral artery disease.  Vascular Interventions: 04/18/2019 PTA of right PTA and ATA. PTA and stent of                         right SFA.                          02/01/2020: Aortogram and Selective Left Lower Extremity                         Angiogram including a Selective Image of the Left                         Anterior Tibial Artery. PTA of the Left Anterior Artery.                         PTA of the Left SFA. Viabahn Stent to the Left SFA. PTA                         of the Left CIA. Comparison Study: 05/25/2020 Performing Technologist: Lynnea Ferrier  [facility]  Examination Guidelines: A complete evaluation includes at minimum, Doppler waveform signals and  systolic blood pressure reading at the level of bilateral brachial, anterior tibial, and posterior tibial arteries, when vessel segments are accessible. Bilateral testing is considered an integral part of a complete examination. Photoelectric Plethysmograph (PPG) waveforms and toe systolic pressure readings are included as required and additional duplex testing as needed. Limited examinations for reoccurring indications may be performed as noted.  ABI Findings: +---------+------------------+-----+---------+--------+ Right    Rt Pressure (mmHg)IndexWaveform Comment  +---------+------------------+-----+---------+--------+ Brachial 172                                      +---------+------------------+-----+---------+--------+ ATA      176               1.02 triphasic         +---------+------------------+-----+---------+--------+ PTA      179               1.04 triphasic         +---------+------------------+-----+---------+--------+ Berline ChoughGreat Toe133               0.77 Normal            +---------+------------------+-----+---------+--------+ +---------+------------------+-----+---------+-------+ Left     Lt Pressure (mmHg)IndexWaveform Comment +---------+------------------+-----+---------+-------+ Brachial 172                                     +---------+------------------+-----+---------+-------+ ATA      149               0.87 triphasic        +---------+------------------+-----+---------+-------+ PTA      181               1.05 triphasic        +---------+------------------+-----+---------+-------+ Great Toe136               0.79 Normal           +---------+------------------+-----+---------+-------+ +-------+-----------+-----------+------------+------------+ ABI/TBIToday's ABIToday's TBIPrevious ABIPrevious TBI +-------+-----------+-----------+------------+------------+ Right  1.04       .77        1.19        .95           +-------+-----------+-----------+------------+------------+ Left   1.05       .79        1.14        .92          +-------+-----------+-----------+------------+------------+ Bilateral ABIs appear essentially unchanged compared to prior study on 05/25/2020.  Summary: Right: Resting right ankle-brachial index is within normal range. No evidence of significant right lower extremity arterial disease. The right toe-brachial index is normal. Left: Resting left ankle-brachial index is within normal range. No evidence of significant left lower extremity arterial disease. The left toe-brachial index is normal.  *See table(s) above for measurements and observations.  Electronically signed by Festus BarrenJason Dew MD on 11/23/2020 at 9:42:43 AM.    Final        Assessment & Plan:   1. Atherosclerosis of artery of extremity with rest pain (HCC)  Recommend:  The patient has evidence of atherosclerosis of the lower extremities with no claudication currently.  The patient does not voice lifestyle limiting changes at this point in time.  Noninvasive studies do not suggest clinically significant change.  No invasive studies, angiography or  surgery at this time The patient should continue walking and begin a more formal exercise program.  The patient should continue antiplatelet therapy and aggressive treatment of the lipid abnormalities  No changes in the patient's medications at this time  The patient should continue wearing graduated compression socks 10-15 mmHg strength to control the mild edema.   The patient should return in 1 year for noninvasive studies.  2. Tobacco use disorder The patient has stopped smoking and its been just over a year.  Abstinence is encouraged  3. Essential (primary) hypertension Continue antihypertensive medications as already ordered, these medications have been reviewed and there are no changes at this time.   4. Hyperlipidemia, unspecified hyperlipidemia type Continue statin  as ordered and reviewed, no changes at this time    Current Outpatient Medications on File Prior to Visit  Medication Sig Dispense Refill  . aspirin 81 MG tablet Take 81 mg by mouth daily.    . chlorthalidone (HYGROTON) 25 MG tablet Take 25 mg by mouth daily.    . clopidogrel (PLAVIX) 75 MG tablet Take 75 mg by mouth daily.    . isosorbide mononitrate (IMDUR) 60 MG 24 hr tablet Take 1 tablet (60 mg total) by mouth daily. 30 tablet 11  . metFORMIN (GLUCOPHAGE) 500 MG tablet Take 1,000 mg by mouth 2 (two) times daily with a meal.    . metoprolol succinate (TOPROL-XL) 25 MG 24 hr tablet Take 25 mg by mouth daily.    . naproxen sodium (ANAPROX) 220 MG tablet Take 220 mg by mouth daily as needed.    . nitroGLYCERIN (NITROSTAT) 0.4 MG SL tablet Place 0.4 mg under the tongue every 5 (five) minutes as needed for chest pain.    . pantoprazole (PROTONIX) 40 MG tablet Take 40 mg by mouth daily.    . rosuvastatin (CRESTOR) 5 MG tablet Take by mouth.     No current facility-administered medications on file prior to visit.    There are no Patient Instructions on file for this visit. No follow-ups on file.   Georgiana Spinner, NP

## 2020-11-23 NOTE — Progress Notes (Incomplete)
Subjective:    Patient ID: Brad Clark, male    DOB: 13-Feb-1956, 65 y.o.   MRN: [id number] Chief Complaint  Patient presents with  . Follow-up    6 Mo U/S     HPI  Review of Systems     Objective:   Physical Exam Vitals reviewed.  HENT:     Head: Normocephalic.  Cardiovascular:     Rate and Rhythm: Normal rate.     Pulses: Normal pulses.  Pulmonary:     Effort: Pulmonary effort is normal.  Neurological:     Mental Status: He is alert and oriented to person, place, and time.  Psychiatric:        Mood and Affect: Mood normal.        Behavior: Behavior normal.        Thought Content: Thought content normal.        Judgment: Judgment normal.     BP (!) 158/89   Pulse 70   Ht 5\' 9"  (1.753 m)   Wt 203 lb (92.1 kg)   BMI 29.98 kg/m   Past Medical History:  Diagnosis Date  . Anginal pain (HCC)   . Coronary artery disease   . GERD (gastroesophageal reflux disease)   . Hypertension   . Peripheral vascular disease (HCC)   . Shortness of breath dyspnea     Social History   Socioeconomic History  . Marital status: Married    Spouse name: Not on file  . Number of children: Not on file  . Years of education: Not on file  . Highest education level: Not on file  Occupational History  . Not on file  Tobacco Use  . Smoking status: Former Smoker    Packs/day: 0.50    Years: 47.00    Pack years: 23.50    Quit date: 10/02/2019    Years since quitting: 1.1  . Smokeless tobacco: Never Used  Substance and Sexual Activity  . Alcohol use: Not Currently  . Drug use: Not on file  . Sexual activity: Not on file  Other Topics Concern  . Not on file  Social History Narrative  . Not on file   Social Determinants of Health   Financial Resource Strain: Not on file  Food Insecurity: Not on file  Transportation Needs: Not on file  Physical Activity: Not on file  Stress: Not on file  Social Connections: Not on file  Intimate Partner Violence: Not on file     Past Surgical History:  Procedure Laterality Date  . BACK SURGERY    . CARDIAC CATHETERIZATION    . CARDIAC CATHETERIZATION N/A 12/05/2015   Procedure: Left Heart Cath and Coronary Angiography;  Surgeon: 12/07/2015, MD;  Location: [facility];  Service: Cardiovascular;  Laterality: N/A;  . LOWER EXTREMITY ANGIOGRAPHY Right 04/18/2019   Procedure: LOWER EXTREMITY ANGIOGRAPHY;  Surgeon: 06/18/2019, MD;  Location: [facility];  Service: Cardiovascular;  Laterality: Right;  . LOWER EXTREMITY ANGIOGRAPHY Right 10/03/2019   Procedure: LOWER EXTREMITY ANGIOGRAPHY;  Surgeon: 10/05/2019, MD;  Location: [facility];  Service: Cardiovascular;  Laterality: Right;  . LOWER EXTREMITY ANGIOGRAPHY Left 02/01/2020   Procedure: LOWER EXTREMITY ANGIOGRAPHY;  Surgeon: 02/03/2020, MD;  Location: [facility];  Service: Cardiovascular;  Laterality: Left;  . PERIPHERAL VASCULAR BALLOON ANGIOPLASTY Right 04/18/2019   Procedure: PERIPHERAL VASCULAR BALLOON ANGIOPLASTY;  Surgeon: 06/18/2019, MD;  Location: [facility];  Service: Cardiovascular;  Laterality: Right;  Right PT & Right AT  . PERIPHERAL VASCULAR INTERVENTION Right 04/18/2019   Procedure: PERIPHERAL VASCULAR INTERVENTION;  Surgeon: Annice Needy, MD;  Location: [facility];  Service: Cardiovascular;  Laterality: Right;  Right SFA    Family History  Problem Relation Age of Onset  . Congenital heart disease Father   . Diabetes Brother     No Known Allergies  CBC Latest Ref Rng & Units 04/12/2014  WBC 3.8 - 10.6 x10 3/mm 3 5.5  Hemoglobin 13.0 - 18.0 g/dL 29.9  Hematocrit 24.2 - 52.0 % 47.6  Platelets 150 - 440 x10 3/mm 3 166      CMP     Component Value Date/Time   NA 135 (L) 04/12/2014 0843   K 4.1 04/12/2014 0843   CL 103 04/12/2014 0843   CO2 26 04/12/2014 0843   GLUCOSE 143 (H) 04/12/2014 0843   BUN 11 02/01/2020 0944   BUN 15 04/12/2014 0843   CREATININE 0.95 02/01/2020 0944    CREATININE 1.01 04/12/2014 0843   CALCIUM 8.0 (L) 04/12/2014 0843   GFRNONAA >60 02/01/2020 0944   GFRNONAA >60 04/12/2014 0843   GFRAA >60 02/01/2020 0944   GFRAA >60 04/12/2014 0843     VAS [place] ABI WITH/WO TBI  Result Date: 11/23/2020 LOWER EXTREMITY DOPPLER STUDY Indications: Peripheral artery disease.  Vascular Interventions: 04/18/2019 PTA of right PTA and ATA. PTA and stent of                         right SFA.                          02/01/2020: Aortogram and Selective Left Lower Extremity                         Angiogram including a Selective Image of the Left                         Anterior Tibial Artery. PTA of the Left Anterior Artery.                         PTA of the Left SFA. Viabahn Stent to the Left SFA. PTA                         of the Left CIA. Comparison Study: 05/25/2020 Performing Technologist: Debbe Bales RVS  Examination Guidelines: A complete evaluation includes at minimum, Doppler waveform signals and systolic blood pressure reading at the level of bilateral brachial, anterior tibial, and posterior tibial arteries, when vessel segments are accessible. Bilateral testing is considered an integral part of a complete examination. Photoelectric Plethysmograph (PPG) waveforms and toe systolic pressure readings are included as required and additional duplex testing as needed. Limited examinations for reoccurring indications may be performed as noted.  ABI Findings: +---------+------------------+-----+---------+--------+ Right    Rt Pressure (mmHg)IndexWaveform Comment  +---------+------------------+-----+---------+--------+ Brachial 172                                      +---------+------------------+-----+---------+--------+ ATA      176               1.02 triphasic         +---------+------------------+-----+---------+--------+  PTA      179               1.04 triphasic         +---------+------------------+-----+---------+--------+ [facility]                0.77 Normal            +---------+------------------+-----+---------+--------+ +---------+------------------+-----+---------+-------+ Left     Lt Pressure (mmHg)IndexWaveform Comment +---------+------------------+-----+---------+-------+ Brachial 172                                     +---------+------------------+-----+---------+-------+ ATA      149               0.87 triphasic        +---------+------------------+-----+---------+-------+ PTA      181               1.05 triphasic        +---------+------------------+-----+---------+-------+ Great Toe136               0.79 Normal           +---------+------------------+-----+---------+-------+ +-------+-----------+-----------+------------+------------+ ABI/TBIToday's ABIToday's TBIPrevious ABIPrevious TBI +-------+-----------+-----------+------------+------------+ Right  1.04       .77        1.19        .95          +-------+-----------+-----------+------------+------------+ Left   1.05       .79        1.14        .92          +-------+-----------+-----------+------------+------------+ Bilateral ABIs appear essentially unchanged compared to prior study on 05/25/2020.  Summary: Right: Resting right ankle-brachial index is within normal range. No evidence of significant right lower extremity arterial disease. The right toe-brachial index is normal. Left: Resting left ankle-brachial index is within normal range. No evidence of significant left lower extremity arterial disease. The left toe-brachial index is normal.  *See table(s) above for measurements and observations.  Electronically signed by Festus Barren MD on 11/23/2020 at 9:42:43 AM.    Final        Assessment & Plan:   1. Atherosclerosis of artery of extremity with rest pain (HCC)  Recommend:  The patient has evidence of atherosclerosis of the lower extremities with no claudication currently.  The patient does not voice lifestyle limiting  changes at this point in time.  Noninvasive studies do not suggest clinically significant change.  No invasive studies, angiography or surgery at this time The patient should continue walking and begin a more formal exercise program.  The patient should continue antiplatelet therapy and aggressive treatment of the lipid abnormalities  No changes in the patient's medications at this time  The patient should continue wearing graduated compression socks 10-15 mmHg strength to control the mild edema.    2. Tobacco use disorder The patient has stopped smoking and its been just over a year.  Abstinence is encouraged  3. Essential (primary) hypertension Continue antihypertensive medications as already ordered, these medications have been reviewed and there are no changes at this time.   4. Hyperlipidemia, unspecified hyperlipidemia type Continue statin as ordered and reviewed, no changes at this time    Current Outpatient Medications on File Prior to Visit  Medication Sig Dispense Refill  . aspirin 81 MG tablet Take 81 mg by mouth daily.    . chlorthalidone (HYGROTON) 25 MG tablet Take 25 mg  by mouth daily.    . clopidogrel (PLAVIX) 75 MG tablet Take 75 mg by mouth daily.    . isosorbide mononitrate (IMDUR) 60 MG 24 hr tablet Take 1 tablet (60 mg total) by mouth daily. 30 tablet 11  . metFORMIN (GLUCOPHAGE) 500 MG tablet Take 1,000 mg by mouth 2 (two) times daily with a meal.    . metoprolol succinate (TOPROL-XL) 25 MG 24 hr tablet Take 25 mg by mouth daily.    . naproxen sodium (ANAPROX) 220 MG tablet Take 220 mg by mouth daily as needed.    . nitroGLYCERIN (NITROSTAT) 0.4 MG SL tablet Place 0.4 mg under the tongue every 5 (five) minutes as needed for chest pain.    . pantoprazole (PROTONIX) 40 MG tablet Take 40 mg by mouth daily.    . rosuvastatin (CRESTOR) 5 MG tablet Take by mouth.     No current facility-administered medications on file prior to visit.    There are no Patient  Instructions on file for this visit. No follow-ups on file.   Georgiana SpinnerFallon E Brown, NP

## 2021-10-01 DIAGNOSIS — E1159 Type 2 diabetes mellitus with other circulatory complications: Secondary | ICD-10-CM | POA: Diagnosis not present

## 2021-10-01 DIAGNOSIS — I152 Hypertension secondary to endocrine disorders: Secondary | ICD-10-CM | POA: Diagnosis not present

## 2021-10-01 DIAGNOSIS — E785 Hyperlipidemia, unspecified: Secondary | ICD-10-CM | POA: Diagnosis not present

## 2021-10-01 DIAGNOSIS — E1169 Type 2 diabetes mellitus with other specified complication: Secondary | ICD-10-CM | POA: Diagnosis not present

## 2021-10-18 DIAGNOSIS — E119 Type 2 diabetes mellitus without complications: Secondary | ICD-10-CM | POA: Diagnosis not present

## 2021-10-23 DIAGNOSIS — I10 Essential (primary) hypertension: Secondary | ICD-10-CM | POA: Diagnosis not present

## 2021-10-23 DIAGNOSIS — R918 Other nonspecific abnormal finding of lung field: Secondary | ICD-10-CM | POA: Diagnosis not present

## 2021-10-23 DIAGNOSIS — R079 Chest pain, unspecified: Secondary | ICD-10-CM | POA: Diagnosis not present

## 2021-10-23 DIAGNOSIS — I2510 Atherosclerotic heart disease of native coronary artery without angina pectoris: Secondary | ICD-10-CM | POA: Diagnosis not present

## 2021-10-23 DIAGNOSIS — R0602 Shortness of breath: Secondary | ICD-10-CM | POA: Diagnosis not present

## 2021-10-23 DIAGNOSIS — I70229 Atherosclerosis of native arteries of extremities with rest pain, unspecified extremity: Secondary | ICD-10-CM | POA: Diagnosis not present

## 2021-10-23 DIAGNOSIS — I25110 Atherosclerotic heart disease of native coronary artery with unstable angina pectoris: Secondary | ICD-10-CM | POA: Diagnosis not present

## 2021-11-21 DIAGNOSIS — Z23 Encounter for immunization: Secondary | ICD-10-CM | POA: Diagnosis not present

## 2021-11-21 DIAGNOSIS — E119 Type 2 diabetes mellitus without complications: Secondary | ICD-10-CM | POA: Diagnosis not present

## 2021-11-21 DIAGNOSIS — S90852A Superficial foreign body, left foot, initial encounter: Secondary | ICD-10-CM | POA: Diagnosis not present

## 2021-11-22 ENCOUNTER — Encounter (INDEPENDENT_AMBULATORY_CARE_PROVIDER_SITE_OTHER): Payer: 59

## 2021-11-22 ENCOUNTER — Other Ambulatory Visit (INDEPENDENT_AMBULATORY_CARE_PROVIDER_SITE_OTHER): Payer: Self-pay | Admitting: Nurse Practitioner

## 2021-11-22 ENCOUNTER — Ambulatory Visit (INDEPENDENT_AMBULATORY_CARE_PROVIDER_SITE_OTHER): Payer: 59 | Admitting: Vascular Surgery

## 2021-11-22 DIAGNOSIS — I739 Peripheral vascular disease, unspecified: Secondary | ICD-10-CM

## 2021-11-22 DIAGNOSIS — Z9889 Other specified postprocedural states: Secondary | ICD-10-CM

## 2021-11-28 DIAGNOSIS — L600 Ingrowing nail: Secondary | ICD-10-CM | POA: Diagnosis not present

## 2021-11-28 DIAGNOSIS — L97421 Non-pressure chronic ulcer of left heel and midfoot limited to breakdown of skin: Secondary | ICD-10-CM | POA: Diagnosis not present

## 2021-11-28 DIAGNOSIS — B351 Tinea unguium: Secondary | ICD-10-CM | POA: Diagnosis not present

## 2021-11-28 DIAGNOSIS — E1159 Type 2 diabetes mellitus with other circulatory complications: Secondary | ICD-10-CM | POA: Diagnosis not present

## 2021-11-28 DIAGNOSIS — S90852A Superficial foreign body, left foot, initial encounter: Secondary | ICD-10-CM | POA: Diagnosis not present

## 2021-12-09 DIAGNOSIS — S90852A Superficial foreign body, left foot, initial encounter: Secondary | ICD-10-CM | POA: Diagnosis not present

## 2021-12-09 DIAGNOSIS — E1159 Type 2 diabetes mellitus with other circulatory complications: Secondary | ICD-10-CM | POA: Diagnosis not present

## 2022-04-02 DIAGNOSIS — Z79899 Other long term (current) drug therapy: Secondary | ICD-10-CM | POA: Diagnosis not present

## 2022-04-02 DIAGNOSIS — E785 Hyperlipidemia, unspecified: Secondary | ICD-10-CM | POA: Diagnosis not present

## 2022-04-02 DIAGNOSIS — E1159 Type 2 diabetes mellitus with other circulatory complications: Secondary | ICD-10-CM | POA: Diagnosis not present

## 2022-04-02 DIAGNOSIS — I152 Hypertension secondary to endocrine disorders: Secondary | ICD-10-CM | POA: Diagnosis not present

## 2022-04-02 DIAGNOSIS — E1169 Type 2 diabetes mellitus with other specified complication: Secondary | ICD-10-CM | POA: Diagnosis not present

## 2022-04-08 DIAGNOSIS — I10 Essential (primary) hypertension: Secondary | ICD-10-CM | POA: Diagnosis not present

## 2022-04-08 DIAGNOSIS — E1169 Type 2 diabetes mellitus with other specified complication: Secondary | ICD-10-CM | POA: Diagnosis not present

## 2022-04-08 DIAGNOSIS — R0602 Shortness of breath: Secondary | ICD-10-CM | POA: Diagnosis not present

## 2022-04-08 DIAGNOSIS — I70219 Atherosclerosis of native arteries of extremities with intermittent claudication, unspecified extremity: Secondary | ICD-10-CM | POA: Diagnosis not present

## 2022-04-08 DIAGNOSIS — R0789 Other chest pain: Secondary | ICD-10-CM | POA: Diagnosis not present

## 2022-04-08 DIAGNOSIS — I2510 Atherosclerotic heart disease of native coronary artery without angina pectoris: Secondary | ICD-10-CM | POA: Diagnosis not present

## 2022-04-08 DIAGNOSIS — E785 Hyperlipidemia, unspecified: Secondary | ICD-10-CM | POA: Diagnosis not present

## 2022-04-08 DIAGNOSIS — R079 Chest pain, unspecified: Secondary | ICD-10-CM | POA: Diagnosis not present

## 2022-04-08 DIAGNOSIS — I25110 Atherosclerotic heart disease of native coronary artery with unstable angina pectoris: Secondary | ICD-10-CM | POA: Diagnosis not present

## 2022-04-12 DIAGNOSIS — T63444A Toxic effect of venom of bees, undetermined, initial encounter: Secondary | ICD-10-CM | POA: Diagnosis not present

## 2022-04-29 DIAGNOSIS — B351 Tinea unguium: Secondary | ICD-10-CM | POA: Diagnosis not present

## 2022-04-29 DIAGNOSIS — L600 Ingrowing nail: Secondary | ICD-10-CM | POA: Diagnosis not present

## 2022-04-29 DIAGNOSIS — E1159 Type 2 diabetes mellitus with other circulatory complications: Secondary | ICD-10-CM | POA: Diagnosis not present

## 2022-07-14 ENCOUNTER — Encounter (INDEPENDENT_AMBULATORY_CARE_PROVIDER_SITE_OTHER): Payer: Self-pay

## 2022-07-21 DIAGNOSIS — M79672 Pain in left foot: Secondary | ICD-10-CM | POA: Diagnosis not present

## 2022-07-21 DIAGNOSIS — M778 Other enthesopathies, not elsewhere classified: Secondary | ICD-10-CM | POA: Diagnosis not present

## 2022-07-21 DIAGNOSIS — E119 Type 2 diabetes mellitus without complications: Secondary | ICD-10-CM | POA: Diagnosis not present

## 2022-07-21 DIAGNOSIS — L03116 Cellulitis of left lower limb: Secondary | ICD-10-CM | POA: Diagnosis not present

## 2022-08-04 DIAGNOSIS — E1159 Type 2 diabetes mellitus with other circulatory complications: Secondary | ICD-10-CM | POA: Diagnosis not present

## 2022-08-04 DIAGNOSIS — B351 Tinea unguium: Secondary | ICD-10-CM | POA: Diagnosis not present

## 2022-08-04 DIAGNOSIS — M10072 Idiopathic gout, left ankle and foot: Secondary | ICD-10-CM | POA: Diagnosis not present

## 2022-09-30 DIAGNOSIS — I25110 Atherosclerotic heart disease of native coronary artery with unstable angina pectoris: Secondary | ICD-10-CM | POA: Diagnosis not present

## 2022-09-30 DIAGNOSIS — Z23 Encounter for immunization: Secondary | ICD-10-CM | POA: Diagnosis not present

## 2022-09-30 DIAGNOSIS — I10 Essential (primary) hypertension: Secondary | ICD-10-CM | POA: Diagnosis not present

## 2022-09-30 DIAGNOSIS — I2510 Atherosclerotic heart disease of native coronary artery without angina pectoris: Secondary | ICD-10-CM | POA: Diagnosis not present

## 2022-09-30 DIAGNOSIS — I152 Hypertension secondary to endocrine disorders: Secondary | ICD-10-CM | POA: Diagnosis not present

## 2022-09-30 DIAGNOSIS — E119 Type 2 diabetes mellitus without complications: Secondary | ICD-10-CM | POA: Diagnosis not present

## 2022-09-30 DIAGNOSIS — R0602 Shortness of breath: Secondary | ICD-10-CM | POA: Diagnosis not present

## 2022-09-30 DIAGNOSIS — I70219 Atherosclerosis of native arteries of extremities with intermittent claudication, unspecified extremity: Secondary | ICD-10-CM | POA: Diagnosis not present

## 2022-09-30 DIAGNOSIS — E1169 Type 2 diabetes mellitus with other specified complication: Secondary | ICD-10-CM | POA: Diagnosis not present

## 2022-09-30 DIAGNOSIS — R0789 Other chest pain: Secondary | ICD-10-CM | POA: Diagnosis not present

## 2022-09-30 DIAGNOSIS — R079 Chest pain, unspecified: Secondary | ICD-10-CM | POA: Diagnosis not present

## 2022-09-30 DIAGNOSIS — E1159 Type 2 diabetes mellitus with other circulatory complications: Secondary | ICD-10-CM | POA: Diagnosis not present

## 2022-10-08 DIAGNOSIS — I152 Hypertension secondary to endocrine disorders: Secondary | ICD-10-CM | POA: Diagnosis not present

## 2022-10-08 DIAGNOSIS — E1169 Type 2 diabetes mellitus with other specified complication: Secondary | ICD-10-CM | POA: Diagnosis not present

## 2022-10-08 DIAGNOSIS — E538 Deficiency of other specified B group vitamins: Secondary | ICD-10-CM | POA: Diagnosis not present

## 2022-10-08 DIAGNOSIS — E785 Hyperlipidemia, unspecified: Secondary | ICD-10-CM | POA: Diagnosis not present

## 2022-10-08 DIAGNOSIS — E1159 Type 2 diabetes mellitus with other circulatory complications: Secondary | ICD-10-CM | POA: Diagnosis not present

## 2022-10-13 DIAGNOSIS — R0602 Shortness of breath: Secondary | ICD-10-CM | POA: Diagnosis not present

## 2022-10-13 DIAGNOSIS — R0789 Other chest pain: Secondary | ICD-10-CM | POA: Diagnosis not present

## 2022-10-13 DIAGNOSIS — I25110 Atherosclerotic heart disease of native coronary artery with unstable angina pectoris: Secondary | ICD-10-CM | POA: Diagnosis not present

## 2022-10-22 DIAGNOSIS — E785 Hyperlipidemia, unspecified: Secondary | ICD-10-CM | POA: Diagnosis not present

## 2022-10-22 DIAGNOSIS — E119 Type 2 diabetes mellitus without complications: Secondary | ICD-10-CM | POA: Diagnosis not present

## 2022-10-22 DIAGNOSIS — I2510 Atherosclerotic heart disease of native coronary artery without angina pectoris: Secondary | ICD-10-CM | POA: Diagnosis not present

## 2022-10-22 DIAGNOSIS — E1159 Type 2 diabetes mellitus with other circulatory complications: Secondary | ICD-10-CM | POA: Diagnosis not present

## 2022-10-22 DIAGNOSIS — I25110 Atherosclerotic heart disease of native coronary artery with unstable angina pectoris: Secondary | ICD-10-CM | POA: Diagnosis not present

## 2022-10-22 DIAGNOSIS — I10 Essential (primary) hypertension: Secondary | ICD-10-CM | POA: Diagnosis not present

## 2022-10-22 DIAGNOSIS — I70219 Atherosclerosis of native arteries of extremities with intermittent claudication, unspecified extremity: Secondary | ICD-10-CM | POA: Diagnosis not present

## 2022-10-22 DIAGNOSIS — E1169 Type 2 diabetes mellitus with other specified complication: Secondary | ICD-10-CM | POA: Diagnosis not present

## 2022-10-22 DIAGNOSIS — I152 Hypertension secondary to endocrine disorders: Secondary | ICD-10-CM | POA: Diagnosis not present

## 2022-11-07 DIAGNOSIS — I152 Hypertension secondary to endocrine disorders: Secondary | ICD-10-CM | POA: Diagnosis not present

## 2022-11-07 DIAGNOSIS — E1159 Type 2 diabetes mellitus with other circulatory complications: Secondary | ICD-10-CM | POA: Diagnosis not present

## 2023-02-20 DIAGNOSIS — I10 Essential (primary) hypertension: Secondary | ICD-10-CM | POA: Diagnosis not present

## 2023-02-20 DIAGNOSIS — E119 Type 2 diabetes mellitus without complications: Secondary | ICD-10-CM | POA: Diagnosis not present

## 2023-02-20 DIAGNOSIS — I2510 Atherosclerotic heart disease of native coronary artery without angina pectoris: Secondary | ICD-10-CM | POA: Diagnosis not present

## 2023-02-20 DIAGNOSIS — I70219 Atherosclerosis of native arteries of extremities with intermittent claudication, unspecified extremity: Secondary | ICD-10-CM | POA: Diagnosis not present

## 2023-02-20 DIAGNOSIS — E1169 Type 2 diabetes mellitus with other specified complication: Secondary | ICD-10-CM | POA: Diagnosis not present

## 2023-02-20 DIAGNOSIS — I152 Hypertension secondary to endocrine disorders: Secondary | ICD-10-CM | POA: Diagnosis not present

## 2023-02-20 DIAGNOSIS — R251 Tremor, unspecified: Secondary | ICD-10-CM | POA: Diagnosis not present

## 2023-02-20 DIAGNOSIS — E1159 Type 2 diabetes mellitus with other circulatory complications: Secondary | ICD-10-CM | POA: Diagnosis not present

## 2023-02-20 DIAGNOSIS — Z7689 Persons encountering health services in other specified circumstances: Secondary | ICD-10-CM | POA: Diagnosis not present

## 2023-02-20 DIAGNOSIS — R079 Chest pain, unspecified: Secondary | ICD-10-CM | POA: Diagnosis not present

## 2023-02-20 DIAGNOSIS — R0602 Shortness of breath: Secondary | ICD-10-CM | POA: Diagnosis not present

## 2023-02-20 DIAGNOSIS — I25110 Atherosclerotic heart disease of native coronary artery with unstable angina pectoris: Secondary | ICD-10-CM | POA: Diagnosis not present

## 2023-03-04 DIAGNOSIS — E1169 Type 2 diabetes mellitus with other specified complication: Secondary | ICD-10-CM | POA: Diagnosis not present

## 2023-03-04 DIAGNOSIS — E1159 Type 2 diabetes mellitus with other circulatory complications: Secondary | ICD-10-CM | POA: Diagnosis not present

## 2023-03-04 DIAGNOSIS — I152 Hypertension secondary to endocrine disorders: Secondary | ICD-10-CM | POA: Diagnosis not present

## 2023-03-04 DIAGNOSIS — E119 Type 2 diabetes mellitus without complications: Secondary | ICD-10-CM | POA: Diagnosis not present

## 2023-03-04 DIAGNOSIS — E785 Hyperlipidemia, unspecified: Secondary | ICD-10-CM | POA: Diagnosis not present

## 2023-03-04 DIAGNOSIS — E538 Deficiency of other specified B group vitamins: Secondary | ICD-10-CM | POA: Diagnosis not present

## 2023-04-20 DIAGNOSIS — M109 Gout, unspecified: Secondary | ICD-10-CM | POA: Diagnosis not present

## 2023-04-28 ENCOUNTER — Telehealth (INDEPENDENT_AMBULATORY_CARE_PROVIDER_SITE_OTHER): Payer: Self-pay

## 2023-04-28 NOTE — Telephone Encounter (Signed)
Do you want any studies or just bring him in to be seen?

## 2023-05-05 ENCOUNTER — Other Ambulatory Visit (INDEPENDENT_AMBULATORY_CARE_PROVIDER_SITE_OTHER): Payer: Self-pay | Admitting: Vascular Surgery

## 2023-05-05 DIAGNOSIS — M7989 Other specified soft tissue disorders: Secondary | ICD-10-CM

## 2023-05-06 ENCOUNTER — Ambulatory Visit (INDEPENDENT_AMBULATORY_CARE_PROVIDER_SITE_OTHER): Payer: No Typology Code available for payment source

## 2023-05-06 DIAGNOSIS — M7989 Other specified soft tissue disorders: Secondary | ICD-10-CM

## 2023-05-12 ENCOUNTER — Ambulatory Visit (INDEPENDENT_AMBULATORY_CARE_PROVIDER_SITE_OTHER): Payer: Self-pay | Admitting: Vascular Surgery

## 2023-05-28 DIAGNOSIS — I152 Hypertension secondary to endocrine disorders: Secondary | ICD-10-CM | POA: Diagnosis not present

## 2023-05-28 DIAGNOSIS — I2510 Atherosclerotic heart disease of native coronary artery without angina pectoris: Secondary | ICD-10-CM | POA: Diagnosis not present

## 2023-05-28 DIAGNOSIS — F17210 Nicotine dependence, cigarettes, uncomplicated: Secondary | ICD-10-CM | POA: Diagnosis not present

## 2023-05-28 DIAGNOSIS — M109 Gout, unspecified: Secondary | ICD-10-CM | POA: Diagnosis not present

## 2023-05-28 DIAGNOSIS — M8580 Other specified disorders of bone density and structure, unspecified site: Secondary | ICD-10-CM | POA: Diagnosis not present

## 2023-05-28 DIAGNOSIS — E1169 Type 2 diabetes mellitus with other specified complication: Secondary | ICD-10-CM | POA: Diagnosis not present

## 2023-05-28 DIAGNOSIS — E1159 Type 2 diabetes mellitus with other circulatory complications: Secondary | ICD-10-CM | POA: Diagnosis not present

## 2023-05-28 DIAGNOSIS — K219 Gastro-esophageal reflux disease without esophagitis: Secondary | ICD-10-CM | POA: Diagnosis not present

## 2023-05-28 DIAGNOSIS — E785 Hyperlipidemia, unspecified: Secondary | ICD-10-CM | POA: Diagnosis not present

## 2023-05-28 DIAGNOSIS — Z125 Encounter for screening for malignant neoplasm of prostate: Secondary | ICD-10-CM | POA: Diagnosis not present

## 2023-05-28 DIAGNOSIS — Z1211 Encounter for screening for malignant neoplasm of colon: Secondary | ICD-10-CM | POA: Diagnosis not present

## 2023-05-28 DIAGNOSIS — E119 Type 2 diabetes mellitus without complications: Secondary | ICD-10-CM | POA: Diagnosis not present

## 2023-05-28 DIAGNOSIS — Z0000 Encounter for general adult medical examination without abnormal findings: Secondary | ICD-10-CM | POA: Diagnosis not present

## 2023-05-28 DIAGNOSIS — M25552 Pain in left hip: Secondary | ICD-10-CM | POA: Diagnosis not present

## 2023-05-28 DIAGNOSIS — I10 Essential (primary) hypertension: Secondary | ICD-10-CM | POA: Diagnosis not present

## 2023-05-28 DIAGNOSIS — M25551 Pain in right hip: Secondary | ICD-10-CM | POA: Diagnosis not present

## 2023-05-28 DIAGNOSIS — I25110 Atherosclerotic heart disease of native coronary artery with unstable angina pectoris: Secondary | ICD-10-CM | POA: Diagnosis not present

## 2023-05-28 DIAGNOSIS — I70219 Atherosclerosis of native arteries of extremities with intermittent claudication, unspecified extremity: Secondary | ICD-10-CM | POA: Diagnosis not present

## 2023-06-02 DIAGNOSIS — Z7689 Persons encountering health services in other specified circumstances: Secondary | ICD-10-CM | POA: Diagnosis not present

## 2023-06-02 DIAGNOSIS — Z789 Other specified health status: Secondary | ICD-10-CM | POA: Diagnosis not present

## 2023-06-02 DIAGNOSIS — G250 Essential tremor: Secondary | ICD-10-CM | POA: Diagnosis not present

## 2023-06-05 ENCOUNTER — Encounter: Payer: Self-pay | Admitting: Acute Care

## 2023-06-25 DIAGNOSIS — E785 Hyperlipidemia, unspecified: Secondary | ICD-10-CM | POA: Diagnosis not present

## 2023-06-25 DIAGNOSIS — I25110 Atherosclerotic heart disease of native coronary artery with unstable angina pectoris: Secondary | ICD-10-CM | POA: Diagnosis not present

## 2023-06-25 DIAGNOSIS — I70219 Atherosclerosis of native arteries of extremities with intermittent claudication, unspecified extremity: Secondary | ICD-10-CM | POA: Diagnosis not present

## 2023-06-25 DIAGNOSIS — E1159 Type 2 diabetes mellitus with other circulatory complications: Secondary | ICD-10-CM | POA: Diagnosis not present

## 2023-06-25 DIAGNOSIS — E1169 Type 2 diabetes mellitus with other specified complication: Secondary | ICD-10-CM | POA: Diagnosis not present

## 2023-06-25 DIAGNOSIS — I2510 Atherosclerotic heart disease of native coronary artery without angina pectoris: Secondary | ICD-10-CM | POA: Diagnosis not present

## 2023-06-25 DIAGNOSIS — I10 Essential (primary) hypertension: Secondary | ICD-10-CM | POA: Diagnosis not present

## 2023-06-25 DIAGNOSIS — I152 Hypertension secondary to endocrine disorders: Secondary | ICD-10-CM | POA: Diagnosis not present

## 2023-06-25 DIAGNOSIS — R0602 Shortness of breath: Secondary | ICD-10-CM | POA: Diagnosis not present

## 2023-06-25 DIAGNOSIS — E119 Type 2 diabetes mellitus without complications: Secondary | ICD-10-CM | POA: Diagnosis not present

## 2023-07-09 DIAGNOSIS — E785 Hyperlipidemia, unspecified: Secondary | ICD-10-CM | POA: Diagnosis not present

## 2023-07-09 DIAGNOSIS — I152 Hypertension secondary to endocrine disorders: Secondary | ICD-10-CM | POA: Diagnosis not present

## 2023-07-09 DIAGNOSIS — E119 Type 2 diabetes mellitus without complications: Secondary | ICD-10-CM | POA: Diagnosis not present

## 2023-07-09 DIAGNOSIS — E1159 Type 2 diabetes mellitus with other circulatory complications: Secondary | ICD-10-CM | POA: Diagnosis not present

## 2023-07-09 DIAGNOSIS — E1169 Type 2 diabetes mellitus with other specified complication: Secondary | ICD-10-CM | POA: Diagnosis not present

## 2023-07-28 DIAGNOSIS — G4733 Obstructive sleep apnea (adult) (pediatric): Secondary | ICD-10-CM | POA: Diagnosis not present

## 2023-08-04 DIAGNOSIS — R000 Tachycardia, unspecified: Secondary | ICD-10-CM | POA: Diagnosis not present

## 2023-08-04 DIAGNOSIS — R0989 Other specified symptoms and signs involving the circulatory and respiratory systems: Secondary | ICD-10-CM | POA: Diagnosis not present

## 2023-08-18 DIAGNOSIS — R000 Tachycardia, unspecified: Secondary | ICD-10-CM | POA: Diagnosis not present

## 2023-09-01 DIAGNOSIS — Z789 Other specified health status: Secondary | ICD-10-CM | POA: Diagnosis not present

## 2023-09-01 DIAGNOSIS — G250 Essential tremor: Secondary | ICD-10-CM | POA: Diagnosis not present

## 2023-09-01 DIAGNOSIS — G4733 Obstructive sleep apnea (adult) (pediatric): Secondary | ICD-10-CM | POA: Diagnosis not present

## 2023-09-02 ENCOUNTER — Other Ambulatory Visit: Payer: Self-pay

## 2023-09-02 DIAGNOSIS — I70219 Atherosclerosis of native arteries of extremities with intermittent claudication, unspecified extremity: Secondary | ICD-10-CM | POA: Diagnosis not present

## 2023-09-02 DIAGNOSIS — M8580 Other specified disorders of bone density and structure, unspecified site: Secondary | ICD-10-CM | POA: Diagnosis not present

## 2023-09-02 DIAGNOSIS — F17210 Nicotine dependence, cigarettes, uncomplicated: Secondary | ICD-10-CM

## 2023-09-02 DIAGNOSIS — Z87891 Personal history of nicotine dependence: Secondary | ICD-10-CM

## 2023-09-02 DIAGNOSIS — I2510 Atherosclerotic heart disease of native coronary artery without angina pectoris: Secondary | ICD-10-CM | POA: Diagnosis not present

## 2023-09-02 DIAGNOSIS — E1169 Type 2 diabetes mellitus with other specified complication: Secondary | ICD-10-CM | POA: Diagnosis not present

## 2023-09-02 DIAGNOSIS — G8929 Other chronic pain: Secondary | ICD-10-CM | POA: Diagnosis not present

## 2023-09-02 DIAGNOSIS — Z122 Encounter for screening for malignant neoplasm of respiratory organs: Secondary | ICD-10-CM

## 2023-09-02 DIAGNOSIS — I25110 Atherosclerotic heart disease of native coronary artery with unstable angina pectoris: Secondary | ICD-10-CM | POA: Diagnosis not present

## 2023-09-02 DIAGNOSIS — E785 Hyperlipidemia, unspecified: Secondary | ICD-10-CM | POA: Diagnosis not present

## 2023-09-02 DIAGNOSIS — M25559 Pain in unspecified hip: Secondary | ICD-10-CM | POA: Diagnosis not present

## 2023-09-02 DIAGNOSIS — I152 Hypertension secondary to endocrine disorders: Secondary | ICD-10-CM | POA: Diagnosis not present

## 2023-09-02 DIAGNOSIS — I10 Essential (primary) hypertension: Secondary | ICD-10-CM | POA: Diagnosis not present

## 2023-09-02 DIAGNOSIS — E119 Type 2 diabetes mellitus without complications: Secondary | ICD-10-CM | POA: Diagnosis not present

## 2023-09-02 DIAGNOSIS — E1159 Type 2 diabetes mellitus with other circulatory complications: Secondary | ICD-10-CM | POA: Diagnosis not present

## 2023-09-10 ENCOUNTER — Ambulatory Visit (INDEPENDENT_AMBULATORY_CARE_PROVIDER_SITE_OTHER): Payer: Self-pay | Admitting: Acute Care

## 2023-09-10 DIAGNOSIS — F17210 Nicotine dependence, cigarettes, uncomplicated: Secondary | ICD-10-CM

## 2023-09-10 DIAGNOSIS — M25551 Pain in right hip: Secondary | ICD-10-CM | POA: Diagnosis not present

## 2023-09-10 DIAGNOSIS — M8588 Other specified disorders of bone density and structure, other site: Secondary | ICD-10-CM | POA: Diagnosis not present

## 2023-09-10 DIAGNOSIS — M1611 Unilateral primary osteoarthritis, right hip: Secondary | ICD-10-CM | POA: Diagnosis not present

## 2023-09-10 NOTE — Patient Instructions (Signed)

## 2023-09-10 NOTE — Progress Notes (Signed)
 Provider Attestation I agree with the documentation of the Shared Decision Making visit,  smoking cessation counseling if appropriate, and verification or eligibility for lung cancer screening as documented by the RN Nurse Navigator.   Raejean Bullock, MSN, AGACNP-BC [facility] See Amion for personal pager PCCM on call pager [phone]     Virtual Visit via Telephone Note  I connected with Brad Clark on 09/10/23 at  2:30 PM EST by telephone and verified that I am speaking with the correct person using two identifiers.  Location: Patient: in home Provider: [address], [place], [place], [address]   Shared Decision Making Visit Lung Cancer Screening Program [phone])   Eligibility: Age 67 y.o. Pack Years Smoking History Calculation 50 (# packs/per year x # years smoked) Recent History of coughing up blood  no Unexplained weight loss? no ( >Than 15 pounds within the last 6 months ) Prior History Lung / other cancer no (Diagnosis within the last 5 years already requiring surveillance chest CT Scans). Smoking Status Current Smoker Former Smokers: Years since quit:  NA  Quit Date: NA  Visit Components: Discussion included one or more decision making aids. yes Discussion included risk/benefits of screening. yes Discussion included potential follow up diagnostic testing for abnormal scans. yes Discussion included meaning and risk of over diagnosis. yes Discussion included meaning and risk of False Positives. yes Discussion included meaning of total radiation exposure. yes  Counseling Included: Importance of adherence to annual lung cancer LDCT screening. yes Impact of comorbidities on ability to participate in the program. yes Ability and willingness to under diagnostic treatment. yes  Smoking Cessation Counseling: Current Smokers:  Discussed importance of smoking cessation. yes Information about tobacco cessation classes and  interventions provided to patient. yes Patient provided with "ticket" for LDCT Scan. yes Symptomatic Patient. no  Counseling NA Diagnosis Code: Tobacco Use Z72.0 Asymptomatic Patient yes  Counseling (Intermediate counseling: > three minutes counseling) Z3086 Former Smokers:  Discussed the importance of maintaining cigarette abstinence. yes Diagnosis Code: Personal History of Nicotine Dependence. V78.469 Information about tobacco cessation classes and interventions provided to patient. Yes Patient provided with "ticket" for LDCT Scan. yes Written Order for Lung Cancer Screening with LDCT placed in Epic. Yes (CT Chest Lung Cancer Screening Low Dose W/O CM) [id number] Z12.2-Screening of respiratory organs Z87.891-Personal history of nicotine dependence   Valentin Gaskins, RN 09/10/23

## 2023-09-14 ENCOUNTER — Ambulatory Visit
Admission: RE | Admit: 2023-09-14 | Discharge: 2023-09-14 | Disposition: A | Discharge status: Home / Self Care | Payer: No Typology Code available for payment source | Source: Ambulatory Visit | Attending: Acute Care | Admitting: Acute Care

## 2023-09-14 DIAGNOSIS — K8020 Calculus of gallbladder without cholecystitis without obstruction: Secondary | ICD-10-CM | POA: Diagnosis not present

## 2023-09-14 DIAGNOSIS — J439 Emphysema, unspecified: Secondary | ICD-10-CM | POA: Diagnosis not present

## 2023-09-14 DIAGNOSIS — F17210 Nicotine dependence, cigarettes, uncomplicated: Secondary | ICD-10-CM | POA: Diagnosis not present

## 2023-09-14 DIAGNOSIS — Z87891 Personal history of nicotine dependence: Secondary | ICD-10-CM | POA: Diagnosis present

## 2023-09-14 DIAGNOSIS — I2510 Atherosclerotic heart disease of native coronary artery without angina pectoris: Secondary | ICD-10-CM | POA: Insufficient documentation

## 2023-09-14 DIAGNOSIS — I700 Atherosclerosis of aorta: Secondary | ICD-10-CM | POA: Diagnosis not present

## 2023-09-14 DIAGNOSIS — Z122 Encounter for screening for malignant neoplasm of respiratory organs: Secondary | ICD-10-CM | POA: Insufficient documentation

## 2023-09-28 ENCOUNTER — Other Ambulatory Visit: Payer: Self-pay

## 2023-09-28 DIAGNOSIS — F17210 Nicotine dependence, cigarettes, uncomplicated: Secondary | ICD-10-CM

## 2023-09-28 DIAGNOSIS — Z87891 Personal history of nicotine dependence: Secondary | ICD-10-CM

## 2023-09-28 DIAGNOSIS — Z122 Encounter for screening for malignant neoplasm of respiratory organs: Secondary | ICD-10-CM

## 2023-10-14 DIAGNOSIS — G250 Essential tremor: Secondary | ICD-10-CM | POA: Diagnosis not present

## 2023-10-14 DIAGNOSIS — G4733 Obstructive sleep apnea (adult) (pediatric): Secondary | ICD-10-CM | POA: Diagnosis not present

## 2023-10-14 DIAGNOSIS — I152 Hypertension secondary to endocrine disorders: Secondary | ICD-10-CM | POA: Diagnosis not present

## 2023-10-14 DIAGNOSIS — E1159 Type 2 diabetes mellitus with other circulatory complications: Secondary | ICD-10-CM | POA: Diagnosis not present

## 2023-10-14 DIAGNOSIS — Z789 Other specified health status: Secondary | ICD-10-CM | POA: Diagnosis not present

## 2023-10-26 DIAGNOSIS — Z23 Encounter for immunization: Secondary | ICD-10-CM | POA: Diagnosis not present

## 2023-10-26 DIAGNOSIS — R0602 Shortness of breath: Secondary | ICD-10-CM | POA: Diagnosis not present

## 2023-10-26 DIAGNOSIS — I2510 Atherosclerotic heart disease of native coronary artery without angina pectoris: Secondary | ICD-10-CM | POA: Diagnosis not present

## 2023-10-26 DIAGNOSIS — E785 Hyperlipidemia, unspecified: Secondary | ICD-10-CM | POA: Diagnosis not present

## 2023-10-26 DIAGNOSIS — I10 Essential (primary) hypertension: Secondary | ICD-10-CM | POA: Diagnosis not present

## 2023-10-26 DIAGNOSIS — E1169 Type 2 diabetes mellitus with other specified complication: Secondary | ICD-10-CM | POA: Diagnosis not present

## 2023-11-11 DIAGNOSIS — I10 Essential (primary) hypertension: Secondary | ICD-10-CM | POA: Diagnosis not present

## 2023-11-11 DIAGNOSIS — Z23 Encounter for immunization: Secondary | ICD-10-CM | POA: Diagnosis not present

## 2023-11-11 DIAGNOSIS — I471 Supraventricular tachycardia, unspecified: Secondary | ICD-10-CM | POA: Diagnosis not present

## 2023-11-11 DIAGNOSIS — E119 Type 2 diabetes mellitus without complications: Secondary | ICD-10-CM | POA: Diagnosis not present

## 2023-11-11 DIAGNOSIS — I2510 Atherosclerotic heart disease of native coronary artery without angina pectoris: Secondary | ICD-10-CM | POA: Diagnosis not present

## 2023-11-16 DIAGNOSIS — R002 Palpitations: Secondary | ICD-10-CM | POA: Diagnosis not present

## 2023-11-16 DIAGNOSIS — I471 Supraventricular tachycardia, unspecified: Secondary | ICD-10-CM | POA: Diagnosis not present

## 2023-11-16 DIAGNOSIS — I2510 Atherosclerotic heart disease of native coronary artery without angina pectoris: Secondary | ICD-10-CM | POA: Diagnosis not present

## 2023-11-16 DIAGNOSIS — E785 Hyperlipidemia, unspecified: Secondary | ICD-10-CM | POA: Diagnosis not present

## 2023-11-16 DIAGNOSIS — R42 Dizziness and giddiness: Secondary | ICD-10-CM | POA: Diagnosis not present

## 2023-11-16 DIAGNOSIS — I10 Essential (primary) hypertension: Secondary | ICD-10-CM | POA: Diagnosis not present

## 2023-11-16 DIAGNOSIS — R0602 Shortness of breath: Secondary | ICD-10-CM | POA: Diagnosis not present

## 2023-11-16 DIAGNOSIS — E119 Type 2 diabetes mellitus without complications: Secondary | ICD-10-CM | POA: Diagnosis not present

## 2023-11-16 DIAGNOSIS — E1169 Type 2 diabetes mellitus with other specified complication: Secondary | ICD-10-CM | POA: Diagnosis not present

## 2023-11-20 DIAGNOSIS — I471 Supraventricular tachycardia, unspecified: Secondary | ICD-10-CM | POA: Diagnosis not present

## 2023-11-20 DIAGNOSIS — I2510 Atherosclerotic heart disease of native coronary artery without angina pectoris: Secondary | ICD-10-CM | POA: Diagnosis not present

## 2023-12-21 DIAGNOSIS — J449 Chronic obstructive pulmonary disease, unspecified: Secondary | ICD-10-CM | POA: Diagnosis not present

## 2023-12-21 DIAGNOSIS — G4733 Obstructive sleep apnea (adult) (pediatric): Secondary | ICD-10-CM | POA: Diagnosis not present

## 2023-12-21 DIAGNOSIS — E1159 Type 2 diabetes mellitus with other circulatory complications: Secondary | ICD-10-CM | POA: Diagnosis not present

## 2023-12-21 DIAGNOSIS — E1169 Type 2 diabetes mellitus with other specified complication: Secondary | ICD-10-CM | POA: Diagnosis not present

## 2023-12-21 DIAGNOSIS — I152 Hypertension secondary to endocrine disorders: Secondary | ICD-10-CM | POA: Diagnosis not present

## 2023-12-21 DIAGNOSIS — E785 Hyperlipidemia, unspecified: Secondary | ICD-10-CM | POA: Diagnosis not present

## 2023-12-21 DIAGNOSIS — R0602 Shortness of breath: Secondary | ICD-10-CM | POA: Diagnosis not present

## 2023-12-21 DIAGNOSIS — I2510 Atherosclerotic heart disease of native coronary artery without angina pectoris: Secondary | ICD-10-CM | POA: Diagnosis not present

## 2023-12-21 DIAGNOSIS — I471 Supraventricular tachycardia, unspecified: Secondary | ICD-10-CM | POA: Diagnosis not present

## 2024-01-04 DIAGNOSIS — F17210 Nicotine dependence, cigarettes, uncomplicated: Secondary | ICD-10-CM | POA: Diagnosis not present

## 2024-01-04 DIAGNOSIS — G4733 Obstructive sleep apnea (adult) (pediatric): Secondary | ICD-10-CM | POA: Diagnosis not present

## 2024-01-04 DIAGNOSIS — R0602 Shortness of breath: Secondary | ICD-10-CM | POA: Diagnosis not present

## 2024-01-04 DIAGNOSIS — J432 Centrilobular emphysema: Secondary | ICD-10-CM | POA: Diagnosis not present

## 2024-02-02 ENCOUNTER — Encounter (INDEPENDENT_AMBULATORY_CARE_PROVIDER_SITE_OTHER): Payer: Self-pay

## 2024-02-05 DIAGNOSIS — R0602 Shortness of breath: Secondary | ICD-10-CM | POA: Diagnosis not present

## 2024-02-05 DIAGNOSIS — F17200 Nicotine dependence, unspecified, uncomplicated: Secondary | ICD-10-CM | POA: Diagnosis not present

## 2024-02-05 DIAGNOSIS — Z1331 Encounter for screening for depression: Secondary | ICD-10-CM | POA: Diagnosis not present

## 2024-02-05 DIAGNOSIS — R918 Other nonspecific abnormal finding of lung field: Secondary | ICD-10-CM | POA: Diagnosis not present

## 2024-02-05 DIAGNOSIS — G4733 Obstructive sleep apnea (adult) (pediatric): Secondary | ICD-10-CM | POA: Diagnosis not present

## 2024-02-05 DIAGNOSIS — E66811 Obesity, class 1: Secondary | ICD-10-CM | POA: Diagnosis not present

## 2024-03-10 DIAGNOSIS — I152 Hypertension secondary to endocrine disorders: Secondary | ICD-10-CM | POA: Diagnosis not present

## 2024-03-10 DIAGNOSIS — E1169 Type 2 diabetes mellitus with other specified complication: Secondary | ICD-10-CM | POA: Diagnosis not present

## 2024-03-10 DIAGNOSIS — E785 Hyperlipidemia, unspecified: Secondary | ICD-10-CM | POA: Diagnosis not present

## 2024-03-10 DIAGNOSIS — E1159 Type 2 diabetes mellitus with other circulatory complications: Secondary | ICD-10-CM | POA: Diagnosis not present

## 2024-03-10 DIAGNOSIS — E119 Type 2 diabetes mellitus without complications: Secondary | ICD-10-CM | POA: Diagnosis not present

## 2024-03-30 DIAGNOSIS — E1159 Type 2 diabetes mellitus with other circulatory complications: Secondary | ICD-10-CM | POA: Diagnosis not present

## 2024-03-30 DIAGNOSIS — I152 Hypertension secondary to endocrine disorders: Secondary | ICD-10-CM | POA: Diagnosis not present

## 2024-03-30 DIAGNOSIS — Z1331 Encounter for screening for depression: Secondary | ICD-10-CM | POA: Diagnosis not present

## 2024-03-30 DIAGNOSIS — E119 Type 2 diabetes mellitus without complications: Secondary | ICD-10-CM | POA: Diagnosis not present

## 2024-03-30 DIAGNOSIS — E785 Hyperlipidemia, unspecified: Secondary | ICD-10-CM | POA: Diagnosis not present

## 2024-03-30 DIAGNOSIS — I2510 Atherosclerotic heart disease of native coronary artery without angina pectoris: Secondary | ICD-10-CM | POA: Diagnosis not present

## 2024-03-30 DIAGNOSIS — K625 Hemorrhage of anus and rectum: Secondary | ICD-10-CM | POA: Diagnosis not present

## 2024-03-30 DIAGNOSIS — I471 Supraventricular tachycardia, unspecified: Secondary | ICD-10-CM | POA: Diagnosis not present

## 2024-03-30 DIAGNOSIS — E1169 Type 2 diabetes mellitus with other specified complication: Secondary | ICD-10-CM | POA: Diagnosis not present

## 2024-03-30 DIAGNOSIS — I10 Essential (primary) hypertension: Secondary | ICD-10-CM | POA: Diagnosis not present

## 2024-04-07 DIAGNOSIS — G4733 Obstructive sleep apnea (adult) (pediatric): Secondary | ICD-10-CM | POA: Diagnosis not present

## 2024-04-07 DIAGNOSIS — J42 Unspecified chronic bronchitis: Secondary | ICD-10-CM | POA: Diagnosis not present

## 2024-04-07 DIAGNOSIS — E669 Obesity, unspecified: Secondary | ICD-10-CM | POA: Diagnosis not present

## 2024-04-07 DIAGNOSIS — R918 Other nonspecific abnormal finding of lung field: Secondary | ICD-10-CM | POA: Diagnosis not present

## 2024-04-07 DIAGNOSIS — J449 Chronic obstructive pulmonary disease, unspecified: Secondary | ICD-10-CM | POA: Diagnosis not present

## 2024-05-10 DIAGNOSIS — I2510 Atherosclerotic heart disease of native coronary artery without angina pectoris: Secondary | ICD-10-CM | POA: Diagnosis not present

## 2024-05-10 DIAGNOSIS — E1159 Type 2 diabetes mellitus with other circulatory complications: Secondary | ICD-10-CM | POA: Diagnosis not present

## 2024-05-10 DIAGNOSIS — Z1211 Encounter for screening for malignant neoplasm of colon: Secondary | ICD-10-CM | POA: Diagnosis not present

## 2024-05-10 DIAGNOSIS — I70219 Atherosclerosis of native arteries of extremities with intermittent claudication, unspecified extremity: Secondary | ICD-10-CM | POA: Diagnosis not present

## 2024-05-10 DIAGNOSIS — K219 Gastro-esophageal reflux disease without esophagitis: Secondary | ICD-10-CM | POA: Diagnosis not present

## 2024-05-10 DIAGNOSIS — Z720 Tobacco use: Secondary | ICD-10-CM | POA: Diagnosis not present

## 2024-05-10 DIAGNOSIS — I471 Supraventricular tachycardia, unspecified: Secondary | ICD-10-CM | POA: Diagnosis not present

## 2024-05-10 DIAGNOSIS — Z0000 Encounter for general adult medical examination without abnormal findings: Secondary | ICD-10-CM | POA: Diagnosis not present

## 2024-05-10 DIAGNOSIS — E119 Type 2 diabetes mellitus without complications: Secondary | ICD-10-CM | POA: Diagnosis not present

## 2024-05-10 DIAGNOSIS — E1169 Type 2 diabetes mellitus with other specified complication: Secondary | ICD-10-CM | POA: Diagnosis not present

## 2024-05-10 DIAGNOSIS — I10 Essential (primary) hypertension: Secondary | ICD-10-CM | POA: Diagnosis not present

## 2024-05-10 DIAGNOSIS — I152 Hypertension secondary to endocrine disorders: Secondary | ICD-10-CM | POA: Diagnosis not present

## 2024-05-10 DIAGNOSIS — I25110 Atherosclerotic heart disease of native coronary artery with unstable angina pectoris: Secondary | ICD-10-CM | POA: Diagnosis not present

## 2024-05-21 DIAGNOSIS — Z1211 Encounter for screening for malignant neoplasm of colon: Secondary | ICD-10-CM | POA: Diagnosis not present

## 2024-05-21 DIAGNOSIS — Z1212 Encounter for screening for malignant neoplasm of rectum: Secondary | ICD-10-CM | POA: Diagnosis not present

## 2024-05-26 LAB — COLOGUARD: COLOGUARD: POSITIVE — AB

## 2024-06-15 DIAGNOSIS — E119 Type 2 diabetes mellitus without complications: Secondary | ICD-10-CM | POA: Diagnosis not present

## 2024-06-20 DIAGNOSIS — E785 Hyperlipidemia, unspecified: Secondary | ICD-10-CM | POA: Diagnosis not present

## 2024-06-20 DIAGNOSIS — I25110 Atherosclerotic heart disease of native coronary artery with unstable angina pectoris: Secondary | ICD-10-CM | POA: Diagnosis not present

## 2024-06-20 DIAGNOSIS — R0789 Other chest pain: Secondary | ICD-10-CM | POA: Diagnosis not present

## 2024-06-20 DIAGNOSIS — Z8679 Personal history of other diseases of the circulatory system: Secondary | ICD-10-CM | POA: Diagnosis not present

## 2024-06-20 DIAGNOSIS — J449 Chronic obstructive pulmonary disease, unspecified: Secondary | ICD-10-CM | POA: Diagnosis not present

## 2024-06-20 DIAGNOSIS — E119 Type 2 diabetes mellitus without complications: Secondary | ICD-10-CM | POA: Diagnosis not present

## 2024-06-20 DIAGNOSIS — R0602 Shortness of breath: Secondary | ICD-10-CM | POA: Diagnosis not present

## 2024-06-20 DIAGNOSIS — G4733 Obstructive sleep apnea (adult) (pediatric): Secondary | ICD-10-CM | POA: Diagnosis not present

## 2024-06-20 DIAGNOSIS — I70219 Atherosclerosis of native arteries of extremities with intermittent claudication, unspecified extremity: Secondary | ICD-10-CM | POA: Diagnosis not present

## 2024-06-20 DIAGNOSIS — I2510 Atherosclerotic heart disease of native coronary artery without angina pectoris: Secondary | ICD-10-CM | POA: Diagnosis not present

## 2024-06-20 DIAGNOSIS — E1169 Type 2 diabetes mellitus with other specified complication: Secondary | ICD-10-CM | POA: Diagnosis not present

## 2024-06-20 DIAGNOSIS — I10 Essential (primary) hypertension: Secondary | ICD-10-CM | POA: Diagnosis not present

## 2024-06-21 ENCOUNTER — Telehealth (INDEPENDENT_AMBULATORY_CARE_PROVIDER_SITE_OTHER): Payer: Self-pay

## 2024-06-21 NOTE — Telephone Encounter (Signed)
 Pt left vm on nurse line asking for a return call. Called pt on wife's number he left on VM @ [phone], patient reports he recently has seen cardiology and was advised he should follow up with our office. Advised patient he was last in office in 2022 and would require referral. Upon further investigation referral from cardiology has been placed and is in patients chart. He states he is interested in following up with our office. Forwarding to [facility] to reach out to schedule patient.   Dois Seip CMA

## 2024-06-22 DIAGNOSIS — R195 Other fecal abnormalities: Secondary | ICD-10-CM | POA: Diagnosis not present

## 2024-06-22 DIAGNOSIS — G4733 Obstructive sleep apnea (adult) (pediatric): Secondary | ICD-10-CM | POA: Diagnosis not present

## 2024-06-22 DIAGNOSIS — I10 Essential (primary) hypertension: Secondary | ICD-10-CM | POA: Diagnosis not present

## 2024-06-22 DIAGNOSIS — E1169 Type 2 diabetes mellitus with other specified complication: Secondary | ICD-10-CM | POA: Diagnosis not present

## 2024-06-22 DIAGNOSIS — E119 Type 2 diabetes mellitus without complications: Secondary | ICD-10-CM | POA: Diagnosis not present

## 2024-06-22 DIAGNOSIS — Z23 Encounter for immunization: Secondary | ICD-10-CM | POA: Diagnosis not present

## 2024-06-22 DIAGNOSIS — Z2821 Immunization not carried out because of patient refusal: Secondary | ICD-10-CM | POA: Diagnosis not present

## 2024-06-22 DIAGNOSIS — F32A Depression, unspecified: Secondary | ICD-10-CM | POA: Diagnosis not present

## 2024-06-22 DIAGNOSIS — E785 Hyperlipidemia, unspecified: Secondary | ICD-10-CM | POA: Diagnosis not present

## 2024-07-20 ENCOUNTER — Other Ambulatory Visit (INDEPENDENT_AMBULATORY_CARE_PROVIDER_SITE_OTHER): Payer: Self-pay | Admitting: Vascular Surgery

## 2024-07-20 DIAGNOSIS — Z9889 Other specified postprocedural states: Secondary | ICD-10-CM

## 2024-07-20 DIAGNOSIS — I739 Peripheral vascular disease, unspecified: Secondary | ICD-10-CM

## 2024-07-21 ENCOUNTER — Ambulatory Visit (INDEPENDENT_AMBULATORY_CARE_PROVIDER_SITE_OTHER): Admitting: Nurse Practitioner

## 2024-07-21 ENCOUNTER — Encounter (INDEPENDENT_AMBULATORY_CARE_PROVIDER_SITE_OTHER): Payer: Self-pay | Admitting: Nurse Practitioner

## 2024-07-21 ENCOUNTER — Other Ambulatory Visit (INDEPENDENT_AMBULATORY_CARE_PROVIDER_SITE_OTHER)

## 2024-07-21 VITALS — BP 148/84 | HR 63 | Resp 18 | Ht 69.0 in | Wt 220.2 lb

## 2024-07-21 DIAGNOSIS — I70213 Atherosclerosis of native arteries of extremities with intermittent claudication, bilateral legs: Secondary | ICD-10-CM | POA: Diagnosis not present

## 2024-07-21 DIAGNOSIS — I10 Essential (primary) hypertension: Secondary | ICD-10-CM | POA: Diagnosis not present

## 2024-07-21 DIAGNOSIS — E119 Type 2 diabetes mellitus without complications: Secondary | ICD-10-CM | POA: Diagnosis not present

## 2024-07-21 DIAGNOSIS — Z9889 Other specified postprocedural states: Secondary | ICD-10-CM

## 2024-07-21 DIAGNOSIS — I739 Peripheral vascular disease, unspecified: Secondary | ICD-10-CM

## 2024-07-22 LAB — VAS US ABI WITH/WO TBI
Left ABI: 1.06
Right ABI: 1.08

## 2024-07-31 ENCOUNTER — Encounter (INDEPENDENT_AMBULATORY_CARE_PROVIDER_SITE_OTHER): Payer: Self-pay | Admitting: Nurse Practitioner

## 2024-07-31 NOTE — Progress Notes (Signed)
 Subjective:    Patient ID: Brad Clark, male    DOB: Sep 26, 1955, 68 y.o.   MRN: [id number] Chief Complaint  Patient presents with   New Patient (Initial Visit)    ABI + see jd/fb/bp. LS 3.11.22. management of PVD. s/p bilateral LE stents + intermittent claudication Brad Clark    The patient returns to the office for followup regarding atherosclerotic changes of the lower extremities and review of the noninvasive studies.  The patient was lost to follow-up for short time.  He has had multiple interventions on his bilateral lower extremities.  There have been no interval changes in lower extremity symptoms. No interval shortening of the patient's claudication distance or development of rest pain symptoms. No new ulcers or wounds have occurred since the last visit.  There have been no significant changes to the patient's overall health care.  The patient denies amaurosis fugax or recent TIA symptoms. There are no documented recent neurological changes noted. There is no history of DVT, PE or superficial thrombophlebitis. The patient denies recent episodes of angina or shortness of breath.   ABI Rt=1.08 and Lt=1.06  (previous ABI's Rt=1.04 and Lt=1.05) Duplex ultrasound of the bilateral lower extremity shows triphasic waveforms with good toe waveforms bilaterally.    Review of Systems  All other systems reviewed and are negative.      Objective:   Physical Exam Vitals reviewed.  HENT:     Head: Normocephalic.  Cardiovascular:     Rate and Rhythm: Normal rate.     Pulses:          Dorsalis pedis pulses are detected w/ Doppler on the right side and detected w/ Doppler on the left side.       Posterior tibial pulses are detected w/ Doppler on the right side and detected w/ Doppler on the left side.  Pulmonary:     Effort: Pulmonary effort is normal.  Skin:    General: Skin is warm and dry.  Neurological:     Mental Status: He is alert and oriented to person, place,  and time.  Psychiatric:        Mood and Affect: Mood normal.        Behavior: Behavior normal.        Thought Content: Thought content normal.        Judgment: Judgment normal.     BP (!) 148/84 Comment: has not had medications this am  Pulse 63   Resp 18   Ht 5' 9 (1.753 m)   Wt 220 lb 3.2 oz (99.9 kg)   BMI 32.52 kg/m   Past Medical History:  Diagnosis Date   Anginal pain    Coronary artery disease    GERD (gastroesophageal reflux disease)    Hypertension    Peripheral vascular disease    Shortness of breath dyspnea     Social History   Socioeconomic History   Marital status: Married    Spouse name: Not on file   Number of children: Not on file   Years of education: Not on file   Highest education level: Not on file  Occupational History   Not on file  Tobacco Use   Smoking status: Former    Current packs/day: 0.00    Average packs/day: 0.5 packs/day for 47.0 years (23.5 ttl pk-yrs)    Types: Cigarettes    Start date: 10/01/1972    Quit date: 10/02/2019    Years since quitting: 4.8   Smokeless tobacco: Never  Substance and Sexual Activity   Alcohol use: Not Currently   Drug use: Not on file   Sexual activity: Not on file  Other Topics Concern   Not on file  Social History Narrative   Not on file   Social Drivers of Health   Financial Resource Strain: Low Risk  (06/22/2024)   Received from [facility]   Overall Financial Resource Strain (CARDIA)    Difficulty of Paying Living Expenses: Not hard at all  Food Insecurity: No Food Insecurity (06/22/2024)   Received from [facility]   Hunger Vital Sign    Within the past 12 months, you worried that your food would run out before you got the money to buy more.: Never true    Within the past 12 months, the food you bought just didn't last and you didn't have money to get more.: Never true  Transportation Needs: No Transportation Needs (06/22/2024)   Received from [facility] - Transportation    In the past 12 months, has lack of transportation kept you from medical appointments or from getting medications?: No    Lack of Transportation (Non-Medical): No  Physical Activity: Not on file  Stress: Not on file  Social Connections: Not on file  Intimate Partner Violence: Not on file    Past Surgical History:  Procedure Laterality Date   BACK SURGERY     CARDIAC CATHETERIZATION     CARDIAC CATHETERIZATION N/A 12/05/2015   Procedure: Left Heart Cath and Coronary Angiography;  Surgeon: Brad DELENA Jude, MD;  Location: [facility] INVASIVE CV [facility];  Service: Cardiovascular;  Laterality: N/A;   LOWER EXTREMITY ANGIOGRAPHY Right 04/18/2019   Procedure: LOWER EXTREMITY ANGIOGRAPHY;  Surgeon: Brad Selinda RAMAN, MD;  Location: [facility];  Service: Cardiovascular;  Laterality: Right;   LOWER EXTREMITY ANGIOGRAPHY Right 10/03/2019   Procedure: LOWER EXTREMITY ANGIOGRAPHY;  Surgeon: Brad Selinda RAMAN, MD;  Location: [facility];  Service: Cardiovascular;  Laterality: Right;   LOWER EXTREMITY ANGIOGRAPHY Left 02/01/2020   Procedure: LOWER EXTREMITY ANGIOGRAPHY;  Surgeon: Brad Selinda RAMAN, MD;  Location: [facility];  Service: Cardiovascular;  Laterality: Left;   PERIPHERAL VASCULAR BALLOON ANGIOPLASTY Right 04/18/2019   Procedure: PERIPHERAL VASCULAR BALLOON ANGIOPLASTY;  Surgeon: Brad Selinda RAMAN, MD;  Location: [facility];  Service: Cardiovascular;  Laterality: Right;  Right PT & Right AT   PERIPHERAL VASCULAR INTERVENTION Right 04/18/2019   Procedure: PERIPHERAL VASCULAR INTERVENTION;  Surgeon: Brad Selinda RAMAN, MD;  Location: [facility];  Service: Cardiovascular;  Laterality: Right;  Right SFA    Family History  Problem Relation Age of Onset   Congenital heart disease Father    Diabetes Brother     No Known Allergies     Latest Ref Rng & Units 04/12/2014    8:43 AM  CBC  WBC 3.8 - 10.6 x10 3/mm 3 5.5   Hemoglobin 13.0 -  18.0 g/dL 83.9   Hematocrit 59.9 - 52.0 % 47.6   Platelets 150 - 440 x10 3/mm 3 166       CMP     Component Value Date/Time   NA 135 (L) 04/12/2014 0843   K 4.1 04/12/2014 0843   CL 103 04/12/2014 0843   CO2 26 04/12/2014 0843   GLUCOSE 143 (H) 04/12/2014 0843   BUN 11 02/01/2020 0944   BUN 15 04/12/2014 0843   CREATININE 0.95 02/01/2020 0944   CREATININE  1.01 04/12/2014 0843   CALCIUM 8.0 (L) 04/12/2014 0843   GFRNONAA >60 02/01/2020 0944   GFRNONAA >60 04/12/2014 0843     VAS US  ABI WITH/WO TBI Result Date: 07/22/2024  LOWER EXTREMITY DOPPLER STUDY Patient Name:  Dominik Yordy Amey  Date of Exam:   07/21/2024 Medical Rec #: [id number]            Accession #:    [id number] Date of Birth: [birth year]           Patient Gender: M Patient Age:   77 years Exam Location:  [facility] Procedure:      VAS US  ABI WITH/WO TBI Referring Phys: SELINDA DEW --------------------------------------------------------------------------------  Indications: Peripheral artery disease.  Vascular Interventions: 04/18/2019 PTA of right PTA and ATA. PTA and stent of                         right SFA.                          02/01/2020: Aortogram and Selective Left Lower Extremity                         Angiogram including a Selective Image of the Left                         Anterior Tibial Artery. PTA of the Left Anterior Artery.                         PTA of the Left SFA. Viabahn Stent to the Left SFA. PTA                         of the Left CIA. Comparison Study: 11/2020 Performing Technologist: Jerel Croak RVT  Examination Guidelines: A complete evaluation includes at minimum, Doppler waveform signals and systolic blood pressure reading at the level of bilateral brachial, anterior tibial, and posterior tibial arteries, when vessel segments are accessible. Bilateral testing is considered an integral part of a complete examination. Photoelectric Plethysmograph (PPG) waveforms and toe systolic pressure  readings are included as required and additional duplex testing as needed. Limited examinations for reoccurring indications may be performed as noted.  ABI Findings: +---------+------------------+-----+---------+--------+ Right    Rt Pressure (mmHg)IndexWaveform Comment  +---------+------------------+-----+---------+--------+ Brachial 177                                      +---------+------------------+-----+---------+--------+ PTA      191               1.08 triphasic         +---------+------------------+-----+---------+--------+ DP       179               1.01 triphasic         +---------+------------------+-----+---------+--------+ Great Toe145               0.82 Normal            +---------+------------------+-----+---------+--------+ +---------+------------------+-----+---------+-------+ Left     Lt Pressure (mmHg)IndexWaveform Comment +---------+------------------+-----+---------+-------+ Brachial 175                                     +---------+------------------+-----+---------+-------+  PTA      188               1.06 triphasic        +---------+------------------+-----+---------+-------+ DP       158               0.89 triphasic        +---------+------------------+-----+---------+-------+ Great Toe130               0.73 Normal           +---------+------------------+-----+---------+-------+ +-------+-----------+-----------+------------+------------+ ABI/TBIToday's ABIToday's TBIPrevious ABIPrevious TBI +-------+-----------+-----------+------------+------------+ Right  1.08       .82        1.04        .77          +-------+-----------+-----------+------------+------------+ Left   1.06       .73        1.05        .79          +-------+-----------+-----------+------------+------------+ Bilateral ABIs and TBIs appear essentially unchanged compared to prior study on 11/2020.  Summary: Right: Resting right ankle-brachial index is  within normal range. The right toe-brachial index is normal.  Left: Resting left ankle-brachial index is within normal range. The left toe-brachial index is normal.  *See table(s) above for measurements and observations.  Electronically signed by Selinda Gu MD on 07/22/2024 at 8:45:28 AM.    Final        Assessment & Plan:   1. Atherosclerosis of native artery of both lower extremities with intermittent claudication (Primary)  Recommend:  The patient has evidence of atherosclerosis of the lower extremities with minimal claudication.  The patient does not voice lifestyle limiting changes at this point in time.  Noninvasive studies do not suggest clinically significant change.  No invasive studies, angiography or surgery at this time The patient should continue walking and begin a more formal exercise program.  The patient should continue antiplatelet therapy and aggressive treatment of the lipid abnormalities  No changes in the patient's medications at this time  Continued surveillance is indicated as atherosclerosis is likely to progress with time.    The patient will continue follow up with noninvasive studies as ordered.   2. Essential (primary) hypertension Continue antihypertensive medications as already ordered, these medications have been reviewed and there are no changes at this time.  3. Type 2 diabetes mellitus without complication, without long-term current use of insulin (HCC) Continue hypoglycemic medications as already ordered, these medications have been reviewed and there are no changes at this time.  Hgb A1C to be monitored as already arranged by primary service   Current Outpatient Medications on File Prior to Visit  Medication Sig Dispense Refill   aspirin 81 MG tablet Take 81 mg by mouth daily.     buPROPion (WELLBUTRIN XL) 300 MG 24 hr tablet Take 300 mg by mouth.     clopidogrel (PLAVIX) 75 MG tablet Take 75 mg by mouth daily.     glipiZIDE (GLUCOTROL XL) 10 MG  24 hr tablet Take 10 mg by mouth daily.     isosorbide  mononitrate (IMDUR ) 60 MG 24 hr tablet Take 1 tablet (60 mg total) by mouth daily. 30 tablet 11   metFORMIN (GLUCOPHAGE) 500 MG tablet Take 1,000 mg by mouth 2 (two) times daily with a meal. (Patient taking differently: Take 500 mg by mouth 1 day or 1 dose.)     metoprolol succinate (TOPROL-XL) 25 MG 24 hr tablet Take 25 mg  by mouth daily. Taking one and a half daily     nitroGLYCERIN  (NITROSTAT ) 0.4 MG SL tablet Place 0.4 mg under the tongue every 5 (five) minutes as needed for chest pain.     pantoprazole (PROTONIX) 40 MG tablet Take 40 mg by mouth daily.     rosuvastatin (CRESTOR) 5 MG tablet Take by mouth.     chlorthalidone (HYGROTON) 25 MG tablet Take 25 mg by mouth daily. (Patient not taking: Reported on 07/21/2024)     metoprolol succinate (TOPROL-XL) 50 MG 24 hr tablet Take 75 mg by mouth daily. (Patient not taking: Reported on 07/21/2024)     naproxen sodium (ANAPROX) 220 MG tablet Take 220 mg by mouth daily as needed. (Patient not taking: Reported on 07/21/2024)     No current facility-administered medications on file prior to visit.    There are no Patient Instructions on file for this visit. No follow-ups on file.   Krystina Strieter E Marveen Donlon, NP

## 2024-08-25 ENCOUNTER — Encounter: Payer: Self-pay | Admitting: Gastroenterology

## 2024-08-25 ENCOUNTER — Ambulatory Visit
Admission: RE | Admit: 2024-08-25 | Discharge: 2024-08-25 | Disposition: A | Discharge status: Home / Self Care | Attending: Gastroenterology | Admitting: Gastroenterology

## 2024-08-25 ENCOUNTER — Ambulatory Visit: Admitting: Anesthesiology

## 2024-08-25 ENCOUNTER — Encounter: Admission: RE | Disposition: A | Payer: Self-pay | Source: Home / Self Care | Attending: Gastroenterology

## 2024-08-25 ENCOUNTER — Other Ambulatory Visit: Payer: Self-pay

## 2024-08-25 DIAGNOSIS — D125 Benign neoplasm of sigmoid colon: Secondary | ICD-10-CM | POA: Diagnosis not present

## 2024-08-25 DIAGNOSIS — R195 Other fecal abnormalities: Secondary | ICD-10-CM | POA: Insufficient documentation

## 2024-08-25 DIAGNOSIS — Z833 Family history of diabetes mellitus: Secondary | ICD-10-CM | POA: Insufficient documentation

## 2024-08-25 DIAGNOSIS — Z7902 Long term (current) use of antithrombotics/antiplatelets: Secondary | ICD-10-CM | POA: Insufficient documentation

## 2024-08-25 DIAGNOSIS — K219 Gastro-esophageal reflux disease without esophagitis: Secondary | ICD-10-CM | POA: Diagnosis not present

## 2024-08-25 DIAGNOSIS — Z87891 Personal history of nicotine dependence: Secondary | ICD-10-CM | POA: Diagnosis not present

## 2024-08-25 DIAGNOSIS — K5730 Diverticulosis of large intestine without perforation or abscess without bleeding: Secondary | ICD-10-CM | POA: Diagnosis not present

## 2024-08-25 DIAGNOSIS — D120 Benign neoplasm of cecum: Secondary | ICD-10-CM | POA: Insufficient documentation

## 2024-08-25 DIAGNOSIS — Z79899 Other long term (current) drug therapy: Secondary | ICD-10-CM | POA: Insufficient documentation

## 2024-08-25 DIAGNOSIS — D122 Benign neoplasm of ascending colon: Secondary | ICD-10-CM | POA: Diagnosis not present

## 2024-08-25 DIAGNOSIS — Z7982 Long term (current) use of aspirin: Secondary | ICD-10-CM | POA: Diagnosis not present

## 2024-08-25 DIAGNOSIS — D124 Benign neoplasm of descending colon: Secondary | ICD-10-CM | POA: Diagnosis not present

## 2024-08-25 DIAGNOSIS — Z955 Presence of coronary angioplasty implant and graft: Secondary | ICD-10-CM | POA: Diagnosis not present

## 2024-08-25 DIAGNOSIS — K625 Hemorrhage of anus and rectum: Secondary | ICD-10-CM | POA: Insufficient documentation

## 2024-08-25 DIAGNOSIS — E1151 Type 2 diabetes mellitus with diabetic peripheral angiopathy without gangrene: Secondary | ICD-10-CM | POA: Insufficient documentation

## 2024-08-25 DIAGNOSIS — G4730 Sleep apnea, unspecified: Secondary | ICD-10-CM | POA: Insufficient documentation

## 2024-08-25 DIAGNOSIS — Z7984 Long term (current) use of oral hypoglycemic drugs: Secondary | ICD-10-CM | POA: Diagnosis not present

## 2024-08-25 DIAGNOSIS — I2510 Atherosclerotic heart disease of native coronary artery without angina pectoris: Secondary | ICD-10-CM | POA: Diagnosis not present

## 2024-08-25 DIAGNOSIS — D175 Benign lipomatous neoplasm of intra-abdominal organs: Secondary | ICD-10-CM | POA: Insufficient documentation

## 2024-08-25 DIAGNOSIS — D128 Benign neoplasm of rectum: Secondary | ICD-10-CM | POA: Insufficient documentation

## 2024-08-25 DIAGNOSIS — I10 Essential (primary) hypertension: Secondary | ICD-10-CM | POA: Diagnosis not present

## 2024-08-25 DIAGNOSIS — D123 Benign neoplasm of transverse colon: Secondary | ICD-10-CM | POA: Insufficient documentation

## 2024-08-25 DIAGNOSIS — Z1211 Encounter for screening for malignant neoplasm of colon: Secondary | ICD-10-CM | POA: Diagnosis present

## 2024-08-25 HISTORY — PX: POLYPECTOMY: SHX149

## 2024-08-25 HISTORY — PX: SUBMUCOSAL INJECTION: SHX5543

## 2024-08-25 HISTORY — PX: COLONOSCOPY: SHX5424

## 2024-08-25 LAB — GLUCOSE, CAPILLARY: Glucose-Capillary: 156 mg/dL — ABNORMAL HIGH (ref 70–99)

## 2024-08-25 SURGERY — COLONOSCOPY
Anesthesia: General

## 2024-08-25 MED ORDER — LIDOCAINE HCL (CARDIAC) PF 100 MG/5ML IV SOSY
PREFILLED_SYRINGE | INTRAVENOUS | Status: DC | PRN
  Administered 2024-08-25: 80 mg via INTRAVENOUS

## 2024-08-25 MED ORDER — DEXMEDETOMIDINE HCL IN NACL 80 MCG/20ML IV SOLN
INTRAVENOUS | Status: DC | PRN
  Administered 2024-08-25: 12 ug via INTRAVENOUS
  Administered 2024-08-25: 8 ug via INTRAVENOUS

## 2024-08-25 MED ORDER — PROPOFOL 500 MG/50ML IV EMUL
INTRAVENOUS | Status: DC | PRN
  Administered 2024-08-25: 75 ug/kg/min via INTRAVENOUS

## 2024-08-25 MED ORDER — PHENYLEPHRINE 80 MCG/ML (10ML) SYRINGE FOR IV PUSH (FOR BLOOD PRESSURE SUPPORT)
PREFILLED_SYRINGE | INTRAVENOUS | Status: AC
  Filled 2024-08-25: qty 10

## 2024-08-25 MED ORDER — SODIUM CHLORIDE 0.9 % IV SOLN: INTRAVENOUS | Status: DC

## 2024-08-25 MED ORDER — PROPOFOL 10 MG/ML IV BOLUS
INTRAVENOUS | Status: DC | PRN
  Administered 2024-08-25 (×2): 50 mg via INTRAVENOUS

## 2024-08-25 MED ORDER — LIDOCAINE HCL (PF) 2 % IJ SOLN
INTRAMUSCULAR | Status: AC
  Filled 2024-08-25: qty 5

## 2024-08-25 NOTE — H&P (Signed)
 Brad JONELLE Brooklyn, MD [facility], [address]IP [address]  [place], [place] [address]  Main: [phone] Fax:  [phone] Pager: [phone]   Primary Care Physician:  Epifanio Alm SQUIBB, MD Primary Gastroenterologist:  Dr. Corinn JONELLE Clark  Pre-Procedure History & Physical: HPI:  OLUWAFEMI Clark is a 68 y.o. male is here for an colonoscopy.   Past Medical History:  Diagnosis Date   Anginal pain    Coronary artery disease    GERD (gastroesophageal reflux disease)    Hypertension    Peripheral vascular disease    Shortness of breath dyspnea     Past Surgical History:  Procedure Laterality Date   BACK SURGERY     CARDIAC CATHETERIZATION     CARDIAC CATHETERIZATION N/A 12/05/2015   Procedure: Left Heart Cath and Coronary Angiography;  Surgeon: Vinie DELENA Jude, MD;  Location: [facility];  Service: Cardiovascular;  Laterality: N/A;   LOWER EXTREMITY ANGIOGRAPHY Right 04/18/2019   Procedure: LOWER EXTREMITY ANGIOGRAPHY;  Surgeon: Marea Selinda RAMAN, MD;  Location: [facility];  Service: Cardiovascular;  Laterality: Right;   LOWER EXTREMITY ANGIOGRAPHY Right 10/03/2019   Procedure: LOWER EXTREMITY ANGIOGRAPHY;  Surgeon: Marea Selinda RAMAN, MD;  Location: [facility];  Service: Cardiovascular;  Laterality: Right;   LOWER EXTREMITY ANGIOGRAPHY Left 02/01/2020   Procedure: LOWER EXTREMITY ANGIOGRAPHY;  Surgeon: Marea Selinda RAMAN, MD;  Location: [facility];  Service: Cardiovascular;  Laterality: Left;   PERIPHERAL VASCULAR BALLOON ANGIOPLASTY Right 04/18/2019   Procedure: PERIPHERAL VASCULAR BALLOON ANGIOPLASTY;  Surgeon: Marea Selinda RAMAN, MD;  Location: [facility];  Service: Cardiovascular;  Laterality: Right;  Right PT & Right AT   PERIPHERAL VASCULAR INTERVENTION Right 04/18/2019   Procedure: PERIPHERAL VASCULAR INTERVENTION;  Surgeon: Marea Selinda RAMAN, MD;  Location: [facility];  Service: Cardiovascular;  Laterality: Right;  Right  SFA    Prior to Admission medications  Medication Sig Start Date End Date Taking? Authorizing Provider  aspirin 81 MG tablet Take 81 mg by mouth daily.   Yes [provider]  buPROPion (WELLBUTRIN XL) 300 MG 24 hr tablet Take 300 mg by mouth. 06/22/24 06/22/25 Yes [provider]  glipiZIDE (GLUCOTROL XL) 10 MG 24 hr tablet Take 10 mg by mouth daily.   Yes [provider]  isosorbide  mononitrate (IMDUR ) 60 MG 24 hr tablet Take 1 tablet (60 mg total) by mouth daily. 12/05/15  Yes Jude Vinie DELENA, MD  metoprolol succinate (TOPROL-XL) 25 MG 24 hr tablet Take 25 mg by mouth daily. Taking one and a half daily   Yes [provider]  pantoprazole (PROTONIX) 40 MG tablet Take 40 mg by mouth daily.   Yes [provider]  chlorthalidone (HYGROTON) 25 MG tablet Take 25 mg by mouth daily. Patient not taking: Reported on 07/21/2024 02/13/19   [provider]  clopidogrel (PLAVIX) 75 MG tablet Take 75 mg by mouth daily.    [provider]  metFORMIN (GLUCOPHAGE) 500 MG tablet Take 1,000 mg by mouth 2 (two) times daily with a meal. Patient taking differently: Take 500 mg by mouth 1 day or 1 dose. 03/03/19   [provider]  metoprolol succinate (TOPROL-XL) 50 MG 24 hr tablet Take 75 mg by mouth daily. Patient not taking: Reported on 07/21/2024    [provider]  naproxen sodium (ANAPROX) 220 MG tablet Take 220 mg by mouth daily as needed. Patient not taking: Reported on 07/21/2024  [provider]  nitroGLYCERIN  (NITROSTAT ) 0.4 MG SL tablet Place 0.4 mg under the tongue every 5 (five) minutes as needed for chest pain.    [provider]  rosuvastatin (CRESTOR) 5 MG tablet Take by mouth. 03/03/19 07/21/24  [provider]    Allergies as of 08/01/2024   (No Known Allergies)    Family History  Problem Relation Age of Onset   Congenital heart disease Father    Diabetes Brother     Social History    Socioeconomic History   Marital status: Married    Spouse name: Not on file   Number of children: Not on file   Years of education: Not on file   Highest education level: Not on file  Occupational History   Not on file  Tobacco Use   Smoking status: Former    Current packs/day: 0.00    Average packs/day: 0.5 packs/day for 47.0 years (23.5 ttl pk-yrs)    Types: Cigarettes    Start date: 10/01/1972    Quit date: 10/02/2019    Years since quitting: 4.9   Smokeless tobacco: Never  Vaping Use   Vaping status: Never Used  Substance and Sexual Activity   Alcohol use: Not Currently   Drug use: Not Currently    Types: Marijuana    Comment: BEEN OVER 5 YRS   Sexual activity: Not on file  Other Topics Concern   Not on file  Social History Narrative   Not on file   Social Drivers of Health   Tobacco Use: Medium Risk (08/25/2024)   Patient History    Smoking Tobacco Use: Former    Smokeless Tobacco Use: Never    Passive Exposure: Not on Actuary Strain: Low Risk  (08/01/2024)   Received from [facility]   Overall Financial Resource Strain (CARDIA)    Difficulty of Paying Living Expenses: Not hard at all  Food Insecurity: No Food Insecurity (08/01/2024)   Received from [facility]   Epic    Within the past 12 months, you worried that your food would run out before you got the money to buy more.: Never true    Within the past 12 months, the food you bought just didn't last and you didn't have money to get more.: Never true  Transportation Needs: No Transportation Needs (08/01/2024)   Received from [facility]    In the past 12 months, has lack of transportation kept you from medical appointments or from getting medications?: No    Lack of Transportation (Non-Medical): No  Physical Activity: Not on file  Stress: Not on file  Social Connections: Not on file  Intimate Partner  Violence: Not on file  Depression (EYV7-0): Not on file  Alcohol Screen: Not on file  Housing: Low Risk  (08/01/2024)   Received from [facility]   Epic    In the last 12 months, was there a time when you were not able to pay the mortgage or rent on time?: No    In the past 12 months, how many times have you moved where you were living?: 0    At any time in the past 12 months, were you homeless or living in a shelter (including now)?: No  Utilities: Not At Risk (08/01/2024)   Received from [facility]   Epic    In the past 12 months has the electric, gas, oil,  or water company threatened to shut off services in your home?: No  Health Literacy: Not on file    Review of Systems: See HPI, otherwise negative ROS  Physical Exam: BP (!) 177/76   Pulse 69   Temp (!) 96.8 F (36 C) (Tympanic)   Resp 18   Ht 5' 9 (1.753 m)   Wt 98.2 kg   SpO2 100%   BMI 31.99 kg/m  General:   Alert,  pleasant and cooperative in NAD Head:  Normocephalic and atraumatic. Neck:  Supple; no masses or thyromegaly. Lungs:  Clear throughout to auscultation.    Heart:  Regular rate and rhythm. Abdomen:  Soft, nontender and nondistended. Normal bowel sounds, without guarding, and without rebound.   Neurologic:  Alert and  oriented x4;  grossly normal neurologically.  Impression/Plan: Taedyn Glasscock Carrington is here for an colonoscopy to be performed for Cologuard positive, rectal bleeding   Risks, benefits, limitations, and alternatives regarding  colonoscopy have been reviewed with the patient.  Questions have been answered.  All parties agreeable.   Brad Brooklyn, MD  08/25/2024, 9:19 AM

## 2024-08-25 NOTE — Op Note (Signed)
 [facility] Patient Name: Brad Clark Procedure Date: 08/25/2024 9:14 AM MRN: [id number] Account #: [id number] Date of Birth: 1956/04/21 Admit Type: Outpatient Age: 68 Room: [facility] ENDO ROOM 3 Gender: Male Note Status: Finalized Instrument Name: Colon Scope [id number] Procedure:             Colonoscopy Indications:           Positive Cologuard test Providers:             Corinn Jess Brooklyn MD, MD Referring MD:          Alm Needle (Referring MD) Medicines:             General Anesthesia Complications:         No immediate complications. Estimated blood loss: None. Procedure:             Pre-Anesthesia Assessment:                        - Prior to the procedure, a History and Physical was                         performed, and patient medications and allergies were                         reviewed. The patient is competent. The risks and                         benefits of the procedure and the sedation options and                         risks were discussed with the patient. All questions                         were answered and informed consent was obtained.                         Patient identification and proposed procedure were                         verified by the physician, the nurse, the                         anesthesiologist, the anesthetist and the technician                         in the pre-procedure area in the procedure room in the                         endoscopy suite. Mental Status Examination: alert and                         oriented. Airway Examination: normal oropharyngeal                         airway and neck mobility. Respiratory Examination:                         clear to auscultation. CV Examination: normal.  Prophylactic Antibiotics: The patient does not require                         prophylactic antibiotics. Prior Anticoagulants: The                         patient has taken  Plavix (clopidogrel), last dose was                         5 days prior to procedure. ASA Grade Assessment: III -                         A patient with severe systemic disease. After                         reviewing the risks and benefits, the patient was                         deemed in satisfactory condition to undergo the                         procedure. The anesthesia plan was to use general                         anesthesia. Immediately prior to administration of                         medications, the patient was re-assessed for adequacy                         to receive sedatives. The heart rate, respiratory                         rate, oxygen saturations, blood pressure, adequacy of                         pulmonary ventilation, and response to care were                         monitored throughout the procedure. The physical                         status of the patient was re-assessed after the                         procedure.                        After obtaining informed consent, the colonoscope was                         passed under direct vision. Throughout the procedure,                         the patient's blood pressure, pulse, and oxygen                         saturations were monitored continuously. The  Colonoscope was introduced through the anus and                         advanced to the the cecum, identified by appendiceal                         orifice and ileocecal valve. The colonoscopy was                         performed with moderate difficulty due to significant                         looping and the patient's body habitus. Successful                         completion of the procedure was aided by applying                         abdominal pressure. The patient tolerated the                         procedure well. The quality of the bowel preparation                         was evaluated using the BBPS [facility] Bowel  Preparation                         Scale) with scores of: Right Colon = 3, Transverse                         Colon = 3 and Left Colon = 3 (entire mucosa seen well                         with no residual staining, small fragments of stool or                         opaque liquid). The total BBPS score equals 9. The                         ileocecal valve, appendiceal orifice, and rectum were                         photographed. Findings:      The perianal and digital rectal examinations were normal. Pertinent       negatives include normal sphincter tone and no palpable rectal lesions.      A 6 mm polyp was found in the cecum. The polyp was sessile. The polyp       was removed with a cold snare. Resection and retrieval were complete.       Estimated blood loss: none.      A 15 mm polyp was found in the proximal ascending colon. The polyp was       sessile. Preparations were made for mucosal resection. Demarcation of       the lesion was performed with narrow band imaging to clearly identify       the boundaries of the lesion. Eleview was injected to raise the lesion.  Snare mucosal resection was performed. Resection and retrieval were       complete. Resected tissue including tissue margins will be examined by       histology.      A 9 mm polyp was found in the transverse colon. The polyp was sessile.       The polyp was removed with a hot snare. Resection and retrieval were       complete.      A 5 mm polyp was found in the descending colon. The polyp was sessile.       The polyp was removed with a cold snare. Resection and retrieval were       complete.      Four sessile polyps were found in the sigmoid colon. The polyps were 8       to 10 mm in size. These polyps were removed with a hot snare. Resection       and retrieval were complete.      A 4 mm polyp was found in the sigmoid colon. The polyp was sessile. The       polyp was removed with a cold snare. Resection and  retrieval were       complete.      Five sessile polyps were found in the rectum. The polyps were 4 to 8 mm       in size. These polyps were removed with a hot snare and a cold snare.       Resection and retrieval were complete.      The retroflexed view of the distal rectum and anal verge was normal and       showed no anal or rectal abnormalities.      Multiple small-mouthed diverticula were found in the recto-sigmoid colon       and sigmoid colon. Impression:            - One 6 mm polyp in the cecum, removed with a cold                         snare. Resected and retrieved.                        - One 15 mm polyp in the proximal ascending colon,                         removed with mucosal resection. Resected and retrieved.                        - One 9 mm polyp in the transverse colon, removed with                         a hot snare. Resected and retrieved.                        - One 5 mm polyp in the descending colon, removed with                         a cold snare. Resected and retrieved.                        - Four 8 to 10 mm polyps in the sigmoid colon, removed  with a hot snare. Resected and retrieved.                        - One 4 mm polyp in the sigmoid colon, removed with a                         cold snare. Resected and retrieved.                        - Five 4 to 8 mm polyps in the rectum, removed with a                         cold snare. Resected and retrieved.                        - The distal rectum and anal verge are normal on                         retroflexion view.                        - Diverticulosis in the recto-sigmoid colon and in the                         sigmoid colon.                        - Mucosal resection was performed. Resection and                         retrieval were complete. Recommendation:        - Discharge patient to home (with escort).                        - Resume previous diet today.                         - Continue present medications.                        - Await pathology results.                        - Repeat colonoscopy in 1 year for surveillance of                         multiple polyps.                        - Resume Plavix (clopidogrel) at prior dose in 5 days.                         Refer to managing physician for further adjustment of                         therapy. Procedure Code(s):     --- Professional ---                        [phone], Colonoscopy, flexible; with endoscopic mucosal  resection                        [id number], 59, Colonoscopy, flexible; with removal of                         tumor(s), polyp(s), or other lesion(s) by snare                         technique Diagnosis Code(s):     --- Professional ---                        D12.0, Benign neoplasm of cecum                        D12.2, Benign neoplasm of ascending colon                        D12.3, Benign neoplasm of transverse colon (hepatic                         flexure or splenic flexure)                        D12.5, Benign neoplasm of sigmoid colon                        D12.8, Benign neoplasm of rectum                        D12.4, Benign neoplasm of descending colon                        R19.5, Other fecal abnormalities                        K57.30, Diverticulosis of large intestine without                         perforation or abscess without bleeding CPT copyright 2022 American [employer]. All rights reserved. The codes documented in this report are preliminary and upon coder review may  be revised to meet current compliance requirements. Dr. Corinn Brooklyn Corinn Jess Brooklyn MD, MD 08/25/2024 10:17:32 AM This report has been signed electronically. Number of Addenda: 0 Note Initiated On: 08/25/2024 9:14 AM Scope Withdrawal Time: 0 hours 32 minutes 35 seconds  Total Procedure Duration: 0 hours 38 minutes 45 seconds  Estimated Blood Loss:  Estimated  blood loss: none.      [facility]

## 2024-08-25 NOTE — Anesthesia Preprocedure Evaluation (Addendum)
 Anesthesia Evaluation  Patient identified by MRN, date of birth, ID band Patient awake    Reviewed: Allergy & Precautions, NPO status , Patient's Chart, lab work & pertinent test results  History of Anesthesia Complications Negative for: history of anesthetic complications  Airway Mallampati: IV   Neck ROM: Full    Dental  (+) Missing, Partial Upper   Pulmonary sleep apnea , former smoker (quit 01/2024)   Pulmonary exam normal breath sounds clear to auscultation       Cardiovascular hypertension, + CAD (s/p stents on Plavix) and + Peripheral Vascular Disease (s/p b/l LE stents)  Normal cardiovascular exam Rhythm:Regular Rate:Normal  ECG 06/20/24: Normal sinus rhythm  Left axis deviation   Myocardial perfusion 10/13/22:  1.  Normal left ventricular function  2.  Normal wall motion  3.  Mild inferior ischemia   Echo 10/13/22:  NORMAL LEFT VENTRICULAR SYSTOLIC FUNCTION  NORMAL RIGHT VENTRICULAR SYSTOLIC FUNCTION  TRIVIAL REGURGITATION NOTED   NO VALVULAR STENOSIS  ESTIMATED LVEF >55%  CALCULATED: 55.9%  GLS: -14.6%  NO SIGNIFICANT CHANGE FROM PREVIOUS ECHO     Neuro/Psych negative neurological ROS     GI/Hepatic ,GERD  ,,  Endo/Other  diabetes, Type 2    Renal/GU negative Renal ROS     Musculoskeletal   Abdominal   Peds  Hematology negative hematology ROS (+)   Anesthesia Other Findings Cardiology note 06/20/24:  68 y.o. 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 Denies any concerns today. Episode of twinge in chest after mowing this weekend, not consistent with angina. Discussed s/s c/w angina and advised follow up if s/s occur.   - Pt with CAD without angina. Stable chronic DOE. Lexiscan  Myoview was performed 10/13/2022. Gated scintigraphy revealed LV ejection fraction of 54%. SPECT analysis revealed mild inferior wall ischemia. Cardiac catheterization was deferred as Myoview was consistent with known anatomy and patient denied recurrent arm or chest pain. Continue medical management with DAPT with aspirin and clopidogrel in addition to isosorbide  60 mg ER qd, metoprolol XL 75 mg qd. Increase rosuvastatin to 10 mg qd for target LDL of < 55 mg/dL.   - H/O SVT by EKG earlier this year. 72-hr Holter with predominant sinus rhythm, 5% sinus tachycardia, infrequent episodes of non-sustained SVT. No recurrence of symptomatic palpitations since last visit. NSR on auscultation today. Normal lab w/u. 2D ECHO 10/13/2022 revealed LVEF greater than 55% with trivial valvular insufficiency. No valvular stenosis. Continue metoprolol XL 75 mg qd.   - Sleep study showed sleep apnea, severity unclear - I do not have access to report. Pt declines CPAP. Pt following with Pulmonology. Recommend discussing oral mandibular device with dentist.   - Continue with smoking cessation. Congratulated pt.  - Encouraged walking for exercise and weight loss.  - Referred to Vascular Surgery for PVD management.   - Continue following with PCP for T2DM management.   Return in about 6 months (  around 12/19/2024).    Reproductive/Obstetrics                              Anesthesia Physical Anesthesia  Plan  ASA: 3  Anesthesia Plan: General   Post-op Pain Management:    Induction: Intravenous  PONV Risk Score and Plan: 2 and Propofol infusion, TIVA and Treatment may vary due to age or medical condition  Airway Management Planned: Natural Airway  Additional Equipment:   Intra-op Plan:   Post-operative Plan:   Informed Consent: I have reviewed the patients History and Physical, chart, labs and discussed the procedure including the risks, benefits and alternatives for the proposed anesthesia with the patient or authorized representative who has indicated his/her understanding and acceptance.       Plan Discussed with: CRNA  Anesthesia Plan Comments: (LMA/GETA backup discussed.  Patient consented for risks of anesthesia including but not limited to:  - adverse reactions to medications - damage to eyes, teeth, lips or other oral mucosa - nerve damage due to positioning  - sore throat or hoarseness - damage to heart, brain, nerves, lungs, other parts of body or loss of life  Informed patient about role of CRNA in peri- and intra-operative care.  Patient voiced understanding.)         Anesthesia Quick Evaluation

## 2024-08-25 NOTE — Anesthesia Postprocedure Evaluation (Signed)
 Anesthesia Post Note  Patient: Brad Clark  Procedure(s) Performed: COLONOSCOPY POLYPECTOMY, INTESTINE  Patient location during evaluation: PACU Anesthesia Type: General Level of consciousness: awake and alert, oriented and patient cooperative Pain management: pain level controlled Vital Signs Assessment: post-procedure vital signs reviewed and stable Respiratory status: spontaneous breathing, nonlabored ventilation and respiratory function stable Cardiovascular status: blood pressure returned to baseline and stable Postop Assessment: adequate PO intake Anesthetic complications: no   No notable events documented.   Last Vitals:  Vitals:   08/25/24 1015 08/25/24 1025  BP: (!) 102/53 112/76  Pulse: 67 67  Resp: 14 18  Temp: 37 C   SpO2: 100% 98%    Last Pain:  Vitals:   08/25/24 1025  TempSrc:   PainSc: 0-No pain                 Alfonso Ruths

## 2024-08-25 NOTE — Transfer of Care (Signed)
 Immediate Anesthesia Transfer of Care Note  Patient: Brad Clark  Procedure(s) Performed: COLONOSCOPY POLYPECTOMY, INTESTINE  Patient Location: PACU  Anesthesia Type:General  Level of Consciousness: sedated  Airway & Oxygen Therapy: Patient Spontanous Breathing  Post-op Assessment: Report given to RN and Post -op Vital signs reviewed and stable  Post vital signs: Reviewed and stable  Last Vitals:  Vitals Value Taken Time  BP 102/53 08/25/24 10:15  Temp    Pulse 70 08/25/24 10:15  Resp 16 08/25/24 10:15  SpO2 98 % 08/25/24 10:15  Vitals shown include unfiled device data.  Last Pain:  Vitals:   08/25/24 0906  TempSrc: Tympanic         Complications: No notable events documented.

## 2024-08-26 LAB — SURGICAL PATHOLOGY

## 2024-09-12 ENCOUNTER — Ambulatory Visit: Payer: Self-pay | Admitting: Gastroenterology

## 2025-07-21 ENCOUNTER — Ambulatory Visit (INDEPENDENT_AMBULATORY_CARE_PROVIDER_SITE_OTHER): Admitting: Nurse Practitioner

## 2025-07-21 ENCOUNTER — Encounter (INDEPENDENT_AMBULATORY_CARE_PROVIDER_SITE_OTHER)
